# Patient Record
Sex: Female | Born: 1995 | Race: Black or African American | Hispanic: No | Marital: Single
Health system: Midwestern US, Community
[De-identification: ages and names within clinical notes are randomized; demographics above are authoritative.]

## PROBLEM LIST (undated history)

## (undated) ENCOUNTER — Inpatient Hospital Stay (HOSPITAL_COMMUNITY): Payer: Self-pay

## (undated) ENCOUNTER — Ambulatory Visit (HOSPITAL_COMMUNITY): Admission: EM | Discharge status: Absent without leave | Payer: Medicaid Other

## (undated) DIAGNOSIS — M1990 Unspecified osteoarthritis, unspecified site: Secondary | ICD-10-CM

## (undated) DIAGNOSIS — A749 Chlamydial infection, unspecified: Secondary | ICD-10-CM

## (undated) DIAGNOSIS — Z8619 Personal history of other infectious and parasitic diseases: Secondary | ICD-10-CM

## (undated) HISTORY — DX: Personal history of other infectious and parasitic diseases: Z86.19

## (undated) HISTORY — PX: NO PAST SURGERIES: SHX2092

---
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
## 2015-05-29 ENCOUNTER — Encounter (HOSPITAL_COMMUNITY): Payer: Self-pay | Admitting: Emergency Medicine

## 2015-05-29 ENCOUNTER — Emergency Department (HOSPITAL_COMMUNITY)
Admission: EM | Admit: 2015-05-29 | Discharge: 2015-05-29 | Disposition: A | Discharge status: Home / Self Care | Payer: Medicaid Other | Attending: Emergency Medicine | Admitting: Emergency Medicine

## 2015-05-29 DIAGNOSIS — Z8751 Personal history of pre-term labor: Secondary | ICD-10-CM | POA: Diagnosis not present

## 2015-05-29 DIAGNOSIS — M1990 Unspecified osteoarthritis, unspecified site: Secondary | ICD-10-CM | POA: Diagnosis not present

## 2015-05-29 DIAGNOSIS — Z79899 Other long term (current) drug therapy: Secondary | ICD-10-CM | POA: Insufficient documentation

## 2015-05-29 DIAGNOSIS — Z8619 Personal history of other infectious and parasitic diseases: Secondary | ICD-10-CM | POA: Diagnosis not present

## 2015-05-29 DIAGNOSIS — J45901 Unspecified asthma with (acute) exacerbation: Secondary | ICD-10-CM | POA: Diagnosis not present

## 2015-05-29 DIAGNOSIS — F17210 Nicotine dependence, cigarettes, uncomplicated: Secondary | ICD-10-CM | POA: Diagnosis not present

## 2015-05-29 DIAGNOSIS — J45909 Unspecified asthma, uncomplicated: Secondary | ICD-10-CM | POA: Diagnosis present

## 2015-05-29 MED ORDER — PREDNISONE 20 MG PO TABS
60.0000 mg | ORAL_TABLET | Freq: Once | ORAL | Status: AC
  Administered 2015-05-29: 60 mg via ORAL
  Filled 2015-05-29: qty 3

## 2015-05-29 MED ORDER — ALBUTEROL SULFATE HFA 108 (90 BASE) MCG/ACT IN AERS: 2.0000 | INHALATION_SPRAY | RESPIRATORY_TRACT | Status: DC | PRN

## 2015-05-29 MED ORDER — ALBUTEROL SULFATE HFA 108 (90 BASE) MCG/ACT IN AERS
2.0000 | INHALATION_SPRAY | RESPIRATORY_TRACT | Status: DC | PRN
  Filled 2015-05-29: qty 6.7

## 2015-05-29 MED ORDER — IPRATROPIUM-ALBUTEROL 0.5-2.5 (3) MG/3ML IN SOLN
3.0000 mL | Freq: Once | RESPIRATORY_TRACT | Status: AC
  Administered 2015-05-29: 3 mL via RESPIRATORY_TRACT
  Filled 2015-05-29: qty 3

## 2015-05-29 MED ORDER — PREDNISONE 20 MG PO TABS: 40.0000 mg | ORAL_TABLET | Freq: Every day | ORAL | Status: DC

## 2015-05-29 NOTE — ED Provider Notes (Signed)
CSN: [id number]     Arrival date & time 05/29/15  0350 History   First MD Initiated Contact with Patient 05/29/15 0402     Chief Complaint  Patient presents with  . Asthma     (Consider location/radiation/quality/duration/timing/severity/associated sxs/prior Treatment) HPI Comments: Patient presents to the emergency department with chief complaint of asthma exacerbation. She states that her asthma started clearing of yesterday. She does not have any medications to take at home for this. She denies any fever productive cough. Her symptoms are exacerbated with the seasons changing. There are no other associated symptoms.  The history is provided by the patient. No language interpreter was used.    Past Medical History  Diagnosis Date  . History of chicken pox   . Asthma     used inhaler 1 wk ago  . Chlamydia infection   . Preterm delivery   . Arthritis    Past Surgical History  Procedure Laterality Date  . No past surgeries     Family History  Problem Relation Age of Onset  . Migraines Mother   . Mitral valve prolapse Mother   . Anemia Maternal Aunt   . Mitral valve prolapse Maternal Aunt   . Heart disease Paternal Uncle   . Mitral valve prolapse Maternal Grandmother   . Dementia Maternal Grandmother   . Kidney disease Paternal Grandmother     dialysis  . Cancer Paternal Grandfather     Lung   Social History  Substance Use Topics  . Smoking status: Current Some Day Smoker -- 0.50 packs/day    Types: Cigarettes  . Smokeless tobacco: None  . Alcohol Use: Yes     Comment: occ   OB History    Gravida Para Term Preterm AB TAB SAB Ectopic Multiple Living   Review of Systems  Respiratory: Positive for chest tightness and wheezing.   All other systems reviewed and are negative.     Allergies  Pollen extract  Home Medications   Prior to Admission medications   Medication Sig Start Date End Date Taking? Authorizing Provider  albuterol  (PROVENTIL HFA;VENTOLIN HFA) 108 (90 BASE) MCG/ACT inhaler Inhale 2 puffs into the lungs every 6 (six) hours as needed for wheezing or shortness of breath (wheezing).    Historical Provider, MD  albuterol (PROVENTIL HFA;VENTOLIN HFA) 108 (90 BASE) MCG/ACT inhaler Inhale 1-2 puffs into the lungs every 6 (six) hours as needed for wheezing or shortness of breath. 09/09/14   Samantha Tripp [place], PA-C  albuterol (PROVENTIL) (2.5 MG/3ML) 0.083% nebulizer solution Take 3 mLs (2.5 mg total) by nebulization every 6 (six) hours as needed for wheezing or shortness of breath. 11/16/13   Tiffany Neva Seat, PA-C  predniSONE (STERAPRED UNI-PAK 21 TAB) 10 MG (21) TBPK tablet Take 1 tablet (10 mg total) by mouth daily. Take 6 tabs by mouth daily  for 2 days, then 5 tabs for 2 days, then 4 tabs for 2 days, then 3 tabs for 2 days, 2 tabs for 2 days, then 1 tab by mouth daily for 2 days 09/09/14   Lelon Mast Tripp Dowless, PA-C   BP 144/80 mmHg  Pulse 84  Temp(Src) 98.5 F (36.9 C) (Oral)  Resp 20  Ht  (1.6 m)  Wt 52.164 kg  BMI 20.38 kg/m2  SpO2 95% Physical Exam  Constitutional: She is oriented to person, place, and time. She appears well-developed and well-nourished.  HENT:  Head:  Normocephalic and atraumatic.  Eyes: Conjunctivae and EOM are normal. Pupils are equal, round, and reactive to light.  Neck: Normal range of motion. Neck supple.  Cardiovascular: Normal rate and regular rhythm.  Exam reveals no gallop and no friction rub.   No murmur heard. Pulmonary/Chest: No respiratory distress. She has wheezes. She has no rales. She exhibits no tenderness.  Increased work of breathing Speaks in 5-7 word sentences Bilateral wheezes Accessory muscle use  Abdominal: Soft. Bowel sounds are normal. She exhibits no distension and no mass. There is no tenderness. There is no rebound and no guarding.  Musculoskeletal: Normal range of motion. She exhibits no edema or tenderness.  Neurological: She is alert and  oriented to person, place, and time.  Skin: Skin is warm and dry.  Psychiatric: She has a normal mood and affect. Her behavior is normal. Judgment and thought content normal.  Nursing note and vitals reviewed.   ED Course  Procedures (including critical care time)   MDM   Final diagnoses:  Asthma exacerbation    Wheezing resolved after nebulizer treatment.  Patient feels much better. Prednisone given in the ED and pt will bd dc with 5 day burst. Pt states they are breathing at baseline. Pt has been instructed to continue using prescribed medications and to speak with PCP about today's exacerbation.      Roxy Horsemanobert Merit [place], PA-C 05/29/15 [id number]  Shon Batonourtney F Horton, MD 05/29/15 [id number]

## 2015-05-29 NOTE — Discharge Instructions (Signed)

## 2015-05-29 NOTE — ED Notes (Signed)
Pt states she has asthma and it started bothering her yesterday  Pt states she called EMS twice yesterday and then it started again tonight  Pt states she does not have an inhaler or nebulizer at home

## 2015-07-14 ENCOUNTER — Encounter: Payer: Self-pay | Admitting: Emergency Medicine

## 2015-07-14 ENCOUNTER — Emergency Department
Admission: EM | Admit: 2015-07-14 | Discharge: 2015-07-14 | Disposition: A | Discharge status: Home / Self Care | Payer: Medicaid Other | Attending: Emergency Medicine | Admitting: Emergency Medicine

## 2015-07-14 DIAGNOSIS — Z7952 Long term (current) use of systemic steroids: Secondary | ICD-10-CM | POA: Insufficient documentation

## 2015-07-14 DIAGNOSIS — M1990 Unspecified osteoarthritis, unspecified site: Secondary | ICD-10-CM | POA: Diagnosis not present

## 2015-07-14 DIAGNOSIS — F17210 Nicotine dependence, cigarettes, uncomplicated: Secondary | ICD-10-CM | POA: Insufficient documentation

## 2015-07-14 DIAGNOSIS — J45901 Unspecified asthma with (acute) exacerbation: Secondary | ICD-10-CM

## 2015-07-14 DIAGNOSIS — R0602 Shortness of breath: Secondary | ICD-10-CM | POA: Diagnosis present

## 2015-07-14 MED ORDER — IPRATROPIUM-ALBUTEROL 0.5-2.5 (3) MG/3ML IN SOLN
3.0000 mL | Freq: Once | RESPIRATORY_TRACT | Status: AC
  Administered 2015-07-14: 3 mL via RESPIRATORY_TRACT

## 2015-07-14 MED ORDER — ALBUTEROL SULFATE HFA 108 (90 BASE) MCG/ACT IN AERS: 2.0000 | INHALATION_SPRAY | RESPIRATORY_TRACT | Status: DC | PRN

## 2015-07-14 MED ORDER — IPRATROPIUM-ALBUTEROL 0.5-2.5 (3) MG/3ML IN SOLN
RESPIRATORY_TRACT | Status: AC
  Filled 2015-07-14: qty 3

## 2015-07-14 NOTE — Discharge Instructions (Signed)
Asthma, Acute Bronchospasm °Acute bronchospasm caused by asthma is also referred to as an asthma attack. Bronchospasm means your air passages become narrowed. The narrowing is caused by inflammation and tightening of the muscles in the air tubes (bronchi) in your lungs. This can make it hard to breathe or cause you to wheeze and cough. °CAUSES °Possible triggers are: °· Animal dander from the skin, hair, or feathers of animals. °· Dust mites contained in house dust. °· Cockroaches. °· Pollen from trees or grass. °· Mold. °· Cigarette or tobacco smoke. °· Air pollutants such as dust, household cleaners, hair sprays, aerosol sprays, paint fumes, strong chemicals, or strong odors. °· Cold air or weather changes. Cold air may trigger inflammation. Winds increase molds and pollens in the air. °· Strong emotions such as crying or laughing hard. °· Stress. °· Certain medicines such as aspirin or beta-blockers. °· Sulfites in foods and drinks, such as dried fruits and wine. °· Infections or inflammatory conditions, such as a flu, cold, or inflammation of the nasal membranes (rhinitis). °· Gastroesophageal reflux disease (GERD). GERD is a condition where stomach acid backs up into your esophagus. °· Exercise or strenuous activity. °SIGNS AND SYMPTOMS  °· Wheezing. °· Excessive coughing, particularly at night. °· Chest tightness. °· Shortness of breath. °DIAGNOSIS  °Your health care provider will ask you about your medical history and perform a physical exam. A chest X-ray or blood testing may be performed to look for other causes of your symptoms or other conditions that may have triggered your asthma attack.  °TREATMENT  °Treatment is aimed at reducing inflammation and opening up the airways in your lungs.  Most asthma attacks are treated with inhaled medicines. These include quick relief or rescue medicines (such as bronchodilators) and controller medicines (such as inhaled corticosteroids). These medicines are sometimes  given through an inhaler or a nebulizer. Systemic steroid medicine taken by mouth or given through an IV tube also can be used to reduce the inflammation when an attack is moderate or severe. Antibiotic medicines are only used if a bacterial infection is present.  °HOME CARE INSTRUCTIONS  °· Rest. °· Drink plenty of liquids. This helps the mucus to remain thin and be easily coughed up. Only use caffeine in moderation and do not use alcohol until you have recovered from your illness. °· Do not smoke. Avoid being exposed to secondhand smoke. °· You play a critical role in keeping yourself in good health. Avoid exposure to things that cause you to wheeze or to have breathing problems. °· Keep your medicines up-to-date and available. Carefully follow your health care provider's treatment plan. °· Take your medicine exactly as prescribed. °· When pollen or pollution is bad, keep windows closed and use an air conditioner or go to places with air conditioning. °· Asthma requires careful medical care. See your health care provider for a follow-up as advised. If you are more than [redacted] weeks pregnant and you were prescribed any new medicines, let your obstetrician know about the visit and how you are doing. Follow up with your health care provider as directed. °· After you have recovered from your asthma attack, make an appointment with your outpatient doctor to talk about ways to reduce the likelihood of future attacks. If you do not have a doctor who manages your asthma, make an appointment with a primary care doctor to discuss your asthma. °SEEK IMMEDIATE MEDICAL CARE IF:  °· You are getting worse. °· You have trouble breathing. If severe, call your local   emergency services (911 in the [place] °· You develop chest pain or discomfort. °· You are vomiting. °· You are not able to keep fluids down. °· You are coughing up yellow, green, brown, or bloody sputum. °· You have a fever and your symptoms suddenly get worse. °· You have  trouble swallowing. °MAKE SURE YOU:  °· Understand these instructions. °· Will watch your condition. °· Will get help right away if you are not doing well or get worse. °  °This information is not intended to replace advice given to you by your health care provider. Make sure you discuss any questions you have with your health care provider. °  °Document Released: 04/21/2006 Document Revised: 01/09/2013 Document Reviewed: 07/12/2012 °Elsevier Interactive Patient Education ©2016 [employer] ° °Asthma Attack Prevention °While you may not be able to control the fact that you have asthma, you can take actions to prevent asthma attacks. The best way to prevent asthma attacks is to maintain good control of your asthma. You can achieve this by: °· Taking your medicines as directed. °· Avoiding things that can irritate your airways or make your asthma symptoms worse (asthma triggers). °· Keeping track of how well your asthma is controlled and of any changes in your symptoms. °· Responding quickly to worsening asthma symptoms (asthma attack). °· Seeking emergency care when it is needed. °WHAT ARE SOME WAYS TO PREVENT AN ASTHMA ATTACK? °Have a Plan °Work with your health care provider to create a written plan for managing and treating your asthma attacks (asthma action plan). This plan includes: °· A list of your asthma triggers and how you can avoid them. °· Information on when medicines should be taken and when their dosages should be changed. °· The use of a device that measures how well your lungs are working (peak flow meter). °Monitor Your Asthma °Use your peak flow meter and record your results in a journal every day. A drop in your peak flow numbers on one or more days may indicate the start of an asthma attack. This can happen even before you start to feel symptoms. You can prevent an asthma attack from getting worse by following the steps in your asthma action plan. °Avoid Asthma Triggers °Work with your asthma  health care provider to find out what your asthma triggers are. This can be done by: °· Allergy testing. °· Keeping a journal that notes when asthma attacks occur and the factors that may have contributed to them. °· Determining if there are other medical conditions that are making your asthma worse. °Once you have determined your asthma triggers, take steps to avoid them. This may include avoiding excessive or prolonged exposure to: °· Dust. Have someone dust and vacuum your home for you once or twice a week. Using a high-efficiency particulate arrestance (HEPA) vacuum is best. °· Smoke. This includes campfire smoke, forest fire smoke, and secondhand smoke from tobacco products. °· Pet dander. Avoid contact with animals that you know you are allergic to. °· Allergens from trees, grasses or pollens. Avoid spending a lot of time outdoors when pollen counts are high, and on very windy days. °· Very cold, dry, or humid air. °· Mold. °· Foods that contain high amounts of sulfites. °· Strong odors. °· Outdoor air pollutants, such as engine exhaust. °· Indoor air pollutants, such as aerosol sprays and fumes from household cleaners. °· Household pests, including dust mites and cockroaches, and pest droppings. °· Certain medicines, including NSAIDs. Always talk to your health   care provider before stopping or starting any new medicines. °Medicines °Take over-the-counter and prescription medicines only as told by your health care provider. Many asthma attacks can be prevented by carefully following your medicine schedule. Taking your medicines correctly is especially important when you cannot avoid certain asthma triggers. °Act Quickly °If an asthma attack does happen, acting quickly can decrease how severe it is and how long it lasts. Take these steps:  °· Pay attention to your symptoms. If you are coughing, wheezing, or having difficulty breathing, do not wait to see if your symptoms go away on their own. Follow your asthma  action plan. °· If you have followed your asthma action plan and your symptoms are not improving, call your health care provider or seek immediate medical care at the nearest hospital. °It is important to note how often you need to use your fast-acting rescue inhaler. If you are using your rescue inhaler more often, it may mean that your asthma is not under control. Adjusting your asthma treatment plan may help you to prevent future asthma attacks and help you to gain better control of your condition. °HOW CAN I PREVENT AN ASTHMA ATTACK WHEN I EXERCISE? °Follow advice from your health care provider about whether you should use your fast-acting inhaler before exercising. Many people with asthma experience exercise-induced bronchoconstriction (EIB). This condition often worsens during vigorous exercise in cold, humid, or dry environments. Usually, people with EIB can stay very active by pre-treating with a fast-acting inhaler before exercising. °  °This information is not intended to replace advice given to you by your health care provider. Make sure you discuss any questions you have with your health care provider. °  °Document Released: 12/23/2008 Document Revised: 09/25/2014 Document Reviewed: 06/06/2014 °Elsevier Interactive Patient Education ©2016 [employer] Inc[employer] ° °

## 2015-07-14 NOTE — ED Provider Notes (Signed)
[facility] Emergency Department Provider Note  ____________________________________________  Time seen: 3:50 AM  I have reviewed the triage vital signs and the nursing notes.   HISTORY  Chief Complaint Shortness of Breath    HPI Tammy [name] is a 20 y.o. female who complains of shortness of breath and wheezing for the last 24 hours. She has asthma, does not have any albuterol because she ran out and she lost a recent prescription given to her by another ED. Has some nonproductive coughing as well. No fever chills or sweats. No chest pain. Otherwise in usual state of health.  During my interview she took her cell phone and began taking voicemail and making a phone call.     Past Medical History  Diagnosis Date  . History of chicken pox   . Asthma     used inhaler 1 wk ago  . Chlamydia infection   . Preterm delivery   . Arthritis      Patient Active Problem List   Diagnosis Date Noted  . NSVD (normal spontaneous vaginal delivery) 11/23/2012  . Asthma, severe persistent, poorly-controlled 06/22/2012  . Asthma, chronic 06/21/2012  . History of unprotected sex 06/21/2012  . Abdominal pregnancy with intrauterine pregnancy 06/21/2012  . Depo-Provera contraceptive status 02/11/2012  . Asthma 11/05/2011  . Postpartum care following vaginal delivery 01/13/2011  . Chlamydia trachomatis infection of other genitourinary sites 01/13/2011     Past Surgical History  Procedure Laterality Date  . No past surgeries       Current Outpatient Rx  Name  Route  Sig  Dispense  Refill  . albuterol (PROVENTIL HFA) 108 (90 Base) MCG/ACT inhaler   Inhalation   Inhale 2 puffs into the lungs every 4 (four) hours as needed for wheezing or shortness of breath.   1 Inhaler   0   . predniSONE (DELTASONE) 20 MG tablet   Oral   Take 2 tablets (40 mg total) by mouth daily.   10 tablet   0      Allergies Pollen extract   Family History  Problem Relation  Age of Onset  . Migraines Mother   . Mitral valve prolapse Mother   . Anemia Maternal Aunt   . Mitral valve prolapse Maternal Aunt   . Heart disease Paternal Uncle   . Mitral valve prolapse Maternal Grandmother   . Dementia Maternal Grandmother   . Kidney disease Paternal Grandmother     dialysis  . Cancer Paternal Grandfather     Lung    Social History Social History  Substance Use Topics  . Smoking status: Current Some Day Smoker -- 0.50 packs/day    Types: Cigarettes  . Smokeless tobacco: None  . Alcohol Use: Yes     Comment: occ    Review of Systems  Constitutional:   No fever or chills.  ENT:   No sore throat. No rhinorrhea. Cardiovascular:   No chest pain. Respiratory:   Positive shortness of breath and nonproductive cough. Gastrointestinal:   Negative for abdominal pain, vomiting and diarrhea.  10-point ROS otherwise negative.  ____________________________________________   PHYSICAL EXAM:  VITAL SIGNS: ED Triage Vitals  Enc Vitals Group     BP 07/14/15 0316 126/72 mmHg     Pulse Rate 07/14/15 0316 68     Resp 07/14/15 0316 25     Temp 07/14/15 0316 97.7 F (36.5 C)     Temp Source 07/14/15 0316 Oral     SpO2 07/14/15 0316 98 %  Weight 07/14/15 0316 120 lb (54.432 kg)     Height 07/14/15 0316 5\' 3"  (1.6 m)     Head Cir --      Peak Flow --      Pain Score --      Pain Loc --      Pain Edu? --      Excl. in GC? --     Vital signs reviewed, nursing assessments reviewed.   Constitutional:   Alert and oriented. Well appearing and in no distress. Eyes:   No scleral icterus. No conjunctival pallor. PERRL. EOMI.  No nystagmus. ENT   Head:   Normocephalic and atraumatic.   Nose:   No congestion/rhinnorhea. No septal hematoma   Mouth/Throat:   MMM, no pharyngeal erythema. No peritonsillar mass.    Neck:   No stridor. No SubQ emphysema. No meningismus. Hematological/Lymphatic/Immunilogical:   No cervical lymphadenopathy. Cardiovascular:    RRR. Symmetric bilateral radial and DP pulses.  No murmurs.  Respiratory:   Normal respiratory effort without tachypnea nor retractions. Diffuse mild expiratory wheezing Gastrointestinal:   Soft and nontender. Non distended. There is no CVA tenderness.  No rebound, rigidity, or guarding. Genitourinary:   deferred Musculoskeletal:   Nontender with normal range of motion in all extremities. No joint effusions.  No lower extremity tenderness.  No edema. Neurologic:   Normal speech and language.  CN 2-10 normal. Motor grossly intact. No gross focal neurologic deficits are appreciated.   ____________________________________________    LABS (pertinent positives/negatives) (all labs ordered are listed, but only abnormal results are displayed) Labs Reviewed - No data to display ____________________________________________   EKG    ____________________________________________    RADIOLOGY    ____________________________________________   PROCEDURES   ____________________________________________   INITIAL IMPRESSION / ASSESSMENT AND PLAN / ED COURSE  Pertinent labs & imaging results that were available during my care of the patient were reviewed by me and considered in my medical decision making (see chart for details).  Patient presents with wheezing, otherwise not in distress and without focal findings. Consistent with asthma exacerbation due to medication noncompliance. After receiving 1 DuoNeb in the ED her wheezing resolved and she feels back to normal. I'll prescribe her albuterol again, follow up with primary care. Low suspicion for pneumonia and pneumothorax or PE.     ____________________________________________   FINAL CLINICAL IMPRESSION(S) / ED DIAGNOSES  Final diagnoses:  Asthma exacerbation       Portions of this note were generated with dragon dictation software. Dictation errors may occur despite best attempts at proofreading.   Sharman CheekPhillip Ardenia Stiner,  MD 07/14/15 [phone]

## 2015-07-14 NOTE — ED Notes (Signed)
Pt. States difficulty breathing for the past 24 hours.  Pt. States out of rescue inhaler.  Pt. Has audible wheezing.

## 2015-08-10 ENCOUNTER — Encounter (HOSPITAL_COMMUNITY): Payer: Self-pay | Admitting: Emergency Medicine

## 2015-08-10 ENCOUNTER — Emergency Department (HOSPITAL_COMMUNITY)
Admission: EM | Admit: 2015-08-10 | Discharge: 2015-08-10 | Disposition: A | Discharge status: Home / Self Care | Payer: Medicaid Other | Attending: Emergency Medicine | Admitting: Emergency Medicine

## 2015-08-10 DIAGNOSIS — F17210 Nicotine dependence, cigarettes, uncomplicated: Secondary | ICD-10-CM | POA: Insufficient documentation

## 2015-08-10 DIAGNOSIS — R0602 Shortness of breath: Secondary | ICD-10-CM | POA: Diagnosis present

## 2015-08-10 DIAGNOSIS — J45901 Unspecified asthma with (acute) exacerbation: Secondary | ICD-10-CM

## 2015-08-10 MED ORDER — ALBUTEROL SULFATE (2.5 MG/3ML) 0.083% IN NEBU
INHALATION_SOLUTION | RESPIRATORY_TRACT | Status: AC
  Filled 2015-08-10: qty 3

## 2015-08-10 MED ORDER — PREDNISONE 50 MG PO TABS: 50.0000 mg | ORAL_TABLET | Freq: Every day | ORAL | 0 refills | Status: AC

## 2015-08-10 MED ORDER — ALBUTEROL SULFATE HFA 108 (90 BASE) MCG/ACT IN AERS: 2.0000 | INHALATION_SPRAY | RESPIRATORY_TRACT | 0 refills | Status: DC | PRN

## 2015-08-10 MED ORDER — ALBUTEROL SULFATE (2.5 MG/3ML) 0.083% IN NEBU
2.5000 mg | INHALATION_SOLUTION | Freq: Once | RESPIRATORY_TRACT | Status: AC
  Administered 2015-08-10: 2.5 mg via RESPIRATORY_TRACT

## 2015-08-10 MED ORDER — ALBUTEROL SULFATE HFA 108 (90 BASE) MCG/ACT IN AERS
4.0000 | INHALATION_SPRAY | Freq: Once | RESPIRATORY_TRACT | Status: AC
  Administered 2015-08-10: 4 via RESPIRATORY_TRACT
  Filled 2015-08-10: qty 6.7

## 2015-08-10 MED ORDER — PREDNISONE 20 MG PO TABS
50.0000 mg | ORAL_TABLET | Freq: Once | ORAL | Status: AC
  Administered 2015-08-10: 50 mg via ORAL
  Filled 2015-08-10: qty 3

## 2015-08-10 NOTE — ED Notes (Signed)
PT did not stay to sign out . Pt stated " I already know everything."

## 2015-08-10 NOTE — ED Provider Notes (Signed)
[facility]-EMERGENCY DEPT Provider Note   CSN: [id number] Arrival date & time: 08/10/15  4098  First Provider Contact:  First MD Initiated Contact with Patient 08/10/15 1102        History   Chief Complaint Chief Complaint  Patient presents with  . Shortness of Breath  . Asthma    HPI Tammy [name] is a 20 y.o. female.  20 yo F with past medical history of mild intermittent asthma who presents with acute onset shortness of breath. The patient has been seen in the ED multiple times for this and receives an inhaler from the ED provider and usually improves. However, she says she ran out of her last inhaler. She does not see a primary care doctor for her asthma. She states over last 2 days she has had progressively worsening. Mild, nonproductive cough and shortness of breath. The symptoms feel similar to her usual asthma exacerbation. She has no history of intubations or admissions for asthma. No recent fevers or chills. No cough or sputum production. No lower extremity swelling. No history of DVT or PE.  HPI  Past Medical History:  Diagnosis Date  . Arthritis   . Asthma    used inhaler 1 wk ago  . Chlamydia infection   . History of chicken pox   . Preterm delivery     Patient Active Problem List   Diagnosis Date Noted  . NSVD (normal spontaneous vaginal delivery) 11/23/2012  . Asthma, severe persistent, poorly-controlled 06/22/2012  . Asthma, chronic 06/21/2012  . History of unprotected sex 06/21/2012  . Abdominal pregnancy with intrauterine pregnancy 06/21/2012  . Depo-Provera contraceptive status 02/11/2012  . Asthma 11/05/2011  . Postpartum care following vaginal delivery 01/13/2011  . Chlamydia trachomatis infection of other genitourinary sites 01/13/2011    Past Surgical History:  Procedure Laterality Date  . NO PAST SURGERIES      OB History    Gravida Para Term Preterm AB Living   SAB TAB Ectopic Multiple Live Births                   Home  Medications    Prior to Admission medications   Medication Sig Start Date End Date Taking? Authorizing Provider  albuterol (PROVENTIL HFA) 108 (90 Base) MCG/ACT inhaler Inhale 2 puffs into the lungs every 4 (four) hours as needed for wheezing or shortness of breath. 08/10/15   Shaune Pollack, MD  predniSONE (DELTASONE) 50 MG tablet Take 1 tablet (50 mg total) by mouth daily. 08/11/15 08/15/15  Shaune Pollack, MD    Family History Family History  Problem Relation Age of Onset  . Migraines Mother   . Mitral valve prolapse Mother   . Anemia Maternal Aunt   . Mitral valve prolapse Maternal Aunt   . Heart disease Paternal Uncle   . Mitral valve prolapse Maternal Grandmother   . Dementia Maternal Grandmother   . Kidney disease Paternal Grandmother     dialysis  . Cancer Paternal Grandfather     Lung    Social History Social History  Substance Use Topics  . Smoking status: Current Some Day Smoker    Packs/day: 0.50    Types: Cigarettes  . Smokeless tobacco: Current User  . Alcohol use Yes     Comment: occ     Allergies   Pollen extract   Review of Systems Review of Systems  Constitutional: Negative for chills, fatigue and fever.  HENT: Negative for  congestion and rhinorrhea.   Respiratory: Positive for cough, shortness of breath and wheezing.   Cardiovascular: Negative for chest pain and leg swelling.  Gastrointestinal: Negative for abdominal pain and nausea.  Genitourinary: Negative for dysuria and flank pain.  Musculoskeletal: Negative for neck pain.  Skin: Negative for rash.  Allergic/Immunologic: Negative for immunocompromised state.  Neurological: Negative for syncope, weakness and headaches.     Physical Exam Updated Vital Signs BP (!) 126/101   Pulse 80   Temp 98.7 F (37.1 C) (Oral)   Resp 19   Ht 5\' 3"  (1.6 m)   Wt 115 lb (52.2 kg)   SpO2 100%   BMI 20.37 kg/m   Physical Exam  Constitutional: She appears well-developed and well-nourished. No  distress.  HENT:  Head: Normocephalic.  Mouth/Throat: Oropharynx is clear and moist. No oropharyngeal exudate.  Eyes: Conjunctivae are normal. Pupils are equal, round, and reactive to light.  Neck: Normal range of motion. Neck supple.  Cardiovascular: Normal rate, regular rhythm and normal heart sounds.  Exam reveals no friction rub.   No murmur heard. Pulmonary/Chest: Effort normal. No respiratory distress. She has wheezes (Faint, end expiratory and only with forced expiration. Normal work of breathing.). She has no rales.  Abdominal: Soft. Bowel sounds are normal. She exhibits no distension. There is no tenderness.  Musculoskeletal: She exhibits no edema.  Neurological: She is alert. She exhibits normal muscle tone.  Skin: Skin is warm.  Nursing note and vitals reviewed.    ED Treatments / Results  Labs (all labs ordered are listed, but only abnormal results are displayed) Labs Reviewed - No data to display  EKG  EKG Interpretation None       Radiology No results found.  Procedures Procedures (including critical care time)  Medications Ordered in ED Medications  albuterol (PROVENTIL) (2.5 MG/3ML) 0.083% nebulizer solution 2.5 mg (2.5 mg Nebulization Given 08/10/15 1012)  predniSONE (DELTASONE) tablet 50 mg (50 mg Oral Given 08/10/15 1156)  albuterol (PROVENTIL HFA;VENTOLIN HFA) 108 (90 Base) MCG/ACT inhaler 4 puff (4 puffs Inhalation Given 08/10/15 1157)     Initial Impression / Assessment and Plan / ED Course  I have reviewed the triage vital signs and the nursing notes.  Pertinent labs & imaging results that were available during my care of the patient were reviewed by me and considered in my medical decision making (see chart for details).  Clinical Course  20 yo female with past medical history of mild intermittent asthma who presents with mild cough, wheezing, and shortness of breath after running out of her albuterol. No fevers, chills, sputum production or focal  lung findings to suggest underlying pneumonia. Vital signs are stable and work of breathing is normal. She has mild wheezing on forced end expiration only with good aeration. Wheezing completely resolved with albuterol inhaler. Will give short course of steroids as well as an inhaler. Encouraged patient to follow up with a primary care doctor and encouraged her to return precautions. Otherwise, patient well-appearing, in no acute distress. No signs of DVT or PE.  Final Clinical Impressions(s) / ED Diagnoses   Final diagnoses:  Asthma exacerbation    New Prescriptions Discharge Medication List as of 08/10/2015 12:02 PM    START taking these medications   Details  predniSONE (DELTASONE) 50 MG tablet Take 1 tablet (50 mg total) by mouth daily., Starting Mon 08/11/2015, Until Fri 08/15/2015, Print         Shaune Pollack, MD 08/10/15 2136

## 2015-08-10 NOTE — ED Triage Notes (Signed)
Pt.l stated, Im out of my inhaler and Im getting ready to go out of town, and yall are the ones who give it to me.

## 2016-05-31 ENCOUNTER — Emergency Department (HOSPITAL_COMMUNITY)
Admission: EM | Admit: 2016-05-31 | Discharge: 2016-05-31 | Disposition: A | Discharge status: Home / Self Care | Payer: Medicaid Other | Attending: Emergency Medicine | Admitting: Emergency Medicine

## 2016-05-31 ENCOUNTER — Encounter (HOSPITAL_COMMUNITY): Payer: Self-pay | Admitting: Emergency Medicine

## 2016-05-31 DIAGNOSIS — A5901 Trichomonal vulvovaginitis: Secondary | ICD-10-CM | POA: Diagnosis not present

## 2016-05-31 DIAGNOSIS — A599 Trichomoniasis, unspecified: Secondary | ICD-10-CM

## 2016-05-31 DIAGNOSIS — N939 Abnormal uterine and vaginal bleeding, unspecified: Secondary | ICD-10-CM

## 2016-05-31 DIAGNOSIS — J45909 Unspecified asthma, uncomplicated: Secondary | ICD-10-CM | POA: Insufficient documentation

## 2016-05-31 DIAGNOSIS — F17210 Nicotine dependence, cigarettes, uncomplicated: Secondary | ICD-10-CM | POA: Diagnosis not present

## 2016-05-31 LAB — WET PREP, GENITAL
Clue Cells Wet Prep HPF POC: NONE SEEN
Sperm: NONE SEEN
Yeast Wet Prep HPF POC: NONE SEEN

## 2016-05-31 LAB — POC URINE PREG, ED: Preg Test, Ur: NEGATIVE

## 2016-05-31 MED ORDER — METRONIDAZOLE 500 MG PO TABS: 2000.0000 mg | ORAL_TABLET | Freq: Once | ORAL | 0 refills | Status: AC

## 2016-05-31 NOTE — ED Notes (Signed)
Pt states, "what are you going to do about my finger." pt denies injury to the area, pt has swelling to R index finger, pt continues to text on phone while this RN & PA is in the room

## 2016-05-31 NOTE — ED Provider Notes (Signed)
[facility]-EMERGENCY DEPT Provider Note   CSN: [id number] Arrival date & time: 05/31/16  4098     History   Chief Complaint Chief Complaint  Patient presents with  . Vaginal Bleeding  . Finger Injury    HPI Tammy [name] is a 21 y.o. female who presents with vaginal bleeding and right index finger pain. Past medical history significant for asthma and prior chlamydia infection. History is limited due to patient's irritability and unwillingness to discuss her symptoms. She was on the phone during most of the history and exam. She states that she's had vaginal bleeding and gone through multiple pads and tampons over the past week and a half. She had her period the end of April which was normal. She has a Nexplanon in her arm and is unsure of when it was placed but thinks it may have been couple months ago. She has not had abnormal vaginal bleeding before. She reports recent unprotected sex but is monogamous. She reports associated nausea and vomiting. No fever, abdominal pain, diarrhea, vaginal discharge, dysuria.   Additionally she has had some right index finger pain. She reports a "bump" on the lateral aspect of her index finger which she squeezed and expressed fluid out of. She states she works in [employer].   HPI  Past Medical History:  Diagnosis Date  . Arthritis   . Asthma    used inhaler 1 wk ago  . Chlamydia infection   . History of chicken pox   . Preterm delivery     Patient Active Problem List   Diagnosis Date Noted  . NSVD (normal spontaneous vaginal delivery) 11/23/2012  . Asthma, severe persistent, poorly-controlled 06/22/2012  . Asthma, chronic 06/21/2012  . History of unprotected sex 06/21/2012  . Abdominal pregnancy with intrauterine pregnancy 06/21/2012  . Depo-Provera contraceptive status 02/11/2012  . Asthma 11/05/2011  . Postpartum care following vaginal delivery 01/13/2011  . Chlamydia trachomatis infection of other genitourinary sites 01/13/2011    Past  Surgical History:  Procedure Laterality Date  . NO PAST SURGERIES      OB History    Gravida Para Term Preterm AB Living   2 2 1 1   2    SAB TAB Ectopic Multiple Live Births           2       Home Medications    Prior to Admission medications   Medication Sig Start Date End Date Taking? Authorizing Provider  albuterol (PROVENTIL HFA) 108 (90 Base) MCG/ACT inhaler Inhale 2 puffs into the lungs every 4 (four) hours as needed for wheezing or shortness of breath. 08/10/15   Shaune Pollack, MD    Family History Family History  Problem Relation Age of Onset  . Migraines Mother   . Mitral valve prolapse Mother   . Anemia Maternal Aunt   . Mitral valve prolapse Maternal Aunt   . Heart disease Paternal Uncle   . Mitral valve prolapse Maternal Grandmother   . Dementia Maternal Grandmother   . Kidney disease Paternal Grandmother        dialysis  . Cancer Paternal Grandfather        Lung    Social History Social History  Substance Use Topics  . Smoking status: Current Some Day Smoker    Packs/day: 0.50    Types: Cigarettes  . Smokeless tobacco: Current User  . Alcohol use Yes     Comment: occ     Allergies   Pollen extract   Review  of Systems Review of Systems  Constitutional: Negative for chills and fever.  Gastrointestinal: Positive for nausea and vomiting. Negative for abdominal pain and diarrhea.  Genitourinary: Positive for vaginal bleeding. Negative for dysuria, pelvic pain and vaginal discharge.  Musculoskeletal: Positive for arthralgias.  All other systems reviewed and are negative.    Physical Exam Updated Vital Signs BP (!) 122/91   Pulse 62   Temp 97.8 F (36.6 C) (Oral)   Resp 12   LMP 05/17/2016 (Approximate)   SpO2 99%   Physical Exam  Constitutional: She is oriented to person, place, and time. She appears well-developed and well-nourished. No distress.  On phone, irritable, and overall uncooperative with history  HENT:  Head:  Normocephalic and atraumatic.  Eyes: Conjunctivae are normal. Pupils are equal, round, and reactive to light. Right eye exhibits no discharge. Left eye exhibits no discharge. No scleral icterus.  Neck: Normal range of motion.  Cardiovascular: Normal rate and regular rhythm.  Exam reveals no gallop and no friction rub.   No murmur heard. Pulmonary/Chest: Effort normal and breath sounds normal. No respiratory distress. She has no wheezes. She has no rales. She exhibits no tenderness.  Abdominal: Soft. Bowel sounds are normal. She exhibits no distension and no mass. There is no tenderness. There is no rebound and no guarding. No hernia.  Genitourinary:  Genitourinary Comments: *Patient was talking on phone during pelvic exam* Pelvic: No inguinal lymphadenopathy or inguinal hernia noted. Normal external genitalia. No pain with speculum insertion. Closed cervical os with normal appearance - no rash or lesions. There is a moderate amount of blood in the vaginal vault and oozing from cervix. On bimanual examination no adnexal tenderness or cervical motion tenderness. Chaperone present during exam.    Musculoskeletal:  Right index finger: Small calloused area over lateral index finger with mild erythema surrounding it with dried serous fluid. FROM of finger. N/V intact  Neurological: She is alert and oriented to person, place, and time.  Skin: Skin is warm and dry.  Psychiatric: Her speech is normal. Her affect is angry. She is agitated. She expresses impulsivity.  Nursing note and vitals reviewed.    ED Treatments / Results  Labs (all labs ordered are listed, but only abnormal results are displayed) Labs Reviewed  WET PREP, GENITAL - Abnormal; Notable for the following:       Result Value   Trich, Wet Prep PRESENT (*)    WBC, Wet Prep HPF POC RARE (*)    All other components within normal limits  POC URINE PREG, ED  GC/CHLAMYDIA PROBE AMP ([place]) NOT AT [facility]    EKG  EKG  Interpretation None       Radiology No results found.  Procedures Procedures (including critical care time)  Medications Ordered in ED Medications - No data to display   Initial Impression / Assessment and Plan / ED Course  I have reviewed the triage vital signs and the nursing notes.  Pertinent labs & imaging results that were available during my care of the patient were reviewed by me and considered in my medical decision making (see chart for details).  21 year old female with abnormal vaginal bleeding. Vital signs are normal. Abdomen is nontender. Pelvic exam remarkable for blood in the vaginal vault and oozing from the cervix. Wet prep and G&C swab were obtained. Patient refused to stay for results of testing or any further workup.  For her finger she was advised to place Neosporin on wound and cover with a  Band-Aid. Also advised warm soaks. [facility]  Of note: Several hours later after pt was discharged, wet prep was noted to be abnormal for trichomonas. I personally called the patient on her phone and discussed her results. I advised that I would print a prescription for Flagyl 2g for her to pick up at the front desk. She verbalized understanding.   Final Clinical Impressions(s) / ED Diagnoses   Final diagnoses:  Vaginal bleeding  Trichomonas infection    New Prescriptions Discharge Medication List as of 05/31/2016 11:45 AM       Bethel BornGekas, [name] Marie, PA-C 05/31/16 2143    Lavera GuiseLiu, Dana Duo, MD 06/03/16 1332

## 2016-05-31 NOTE — ED Notes (Signed)
Pelvic exam done by Tresa EndoKelly - PA and Kenney Housemananya - EMT assisted.

## 2016-05-31 NOTE — ED Notes (Signed)
Pt declines vital signs, pt states, "i have got to go." this RN walked pt to registration so that she could update her contact information, pt provided a packet of bacitracin for finger upon discharge

## 2016-05-31 NOTE — ED Triage Notes (Signed)
Pt reports heavy vaginal bleeding x1.5 weeks, states she had her period at the end of April, has implant for birth control. Also reports swelling and pain to right index finger, denies injury.

## 2016-05-31 NOTE — ED Notes (Signed)
Pt agitated & found walking in the hallway, states, "if no one is here to see me by 11:45 I am leaving." pt directed to her room to discuss care plan, pt remains on phone and uncooperative, pt demanding work note, pt encouraged to stay for eval, this RN spoke with provider who agrees to perform the pelvic exam at this time, pt updated on plan, pt remains agitated and continues to threaten to leave, pt states, "i have been here for 3 hours and no one has done anything for me." this Rn apologized for the pts wait, PA at bedside

## 2016-05-31 NOTE — Discharge Instructions (Signed)
Please follow up with [facility] if bleeding continues - you will be called if any of your results are abnormal here. Do warm soaks and put neosporin on finger

## 2016-06-01 LAB — GC/CHLAMYDIA PROBE AMP (~~LOC~~) NOT AT ARMC
Chlamydia: NEGATIVE
Neisseria Gonorrhea: NEGATIVE

## 2016-06-02 ENCOUNTER — Emergency Department (HOSPITAL_COMMUNITY)
Admission: EM | Admit: 2016-06-02 | Discharge: 2016-06-02 | Disposition: A | Discharge status: Home / Self Care | Payer: Medicaid Other | Attending: Emergency Medicine | Admitting: Emergency Medicine

## 2016-06-02 ENCOUNTER — Encounter (HOSPITAL_COMMUNITY): Payer: Self-pay

## 2016-06-02 DIAGNOSIS — F17210 Nicotine dependence, cigarettes, uncomplicated: Secondary | ICD-10-CM | POA: Diagnosis not present

## 2016-06-02 DIAGNOSIS — N898 Other specified noninflammatory disorders of vagina: Secondary | ICD-10-CM | POA: Diagnosis present

## 2016-06-02 DIAGNOSIS — J45909 Unspecified asthma, uncomplicated: Secondary | ICD-10-CM | POA: Diagnosis not present

## 2016-06-02 DIAGNOSIS — A599 Trichomoniasis, unspecified: Secondary | ICD-10-CM | POA: Diagnosis not present

## 2016-06-02 MED ORDER — METRONIDAZOLE 500 MG PO TABS
2000.0000 mg | ORAL_TABLET | Freq: Once | ORAL | Status: AC
  Administered 2016-06-02: 2000 mg via ORAL
  Filled 2016-06-02: qty 4

## 2016-06-02 NOTE — Discharge Instructions (Signed)
Your partner needs to be tested. Do not have sexual intercourse until you and your partner has been treated. If you have sexual intercourse before then this could cause reinfection.  Follow-up with the primary care doctor's listed below.  Return the emergency Department for any fever, abdominal pain, vaginal bleeding, vaginal discharge or any other worsening or concerning symptoms.   If you do not have a primary care doctor you see regularly, please you the list below. Please call them to arrange for follow-up.    No Primary Care Doctor Call [facility]  [phone] Other agencies that provide inexpensive medical care    [facility]  [phone]    [facility]  [phone]    [facility]  [phone]    [facility]  [phone]    [facility]  [phone]    [facility]  [phone]

## 2016-06-02 NOTE — ED Provider Notes (Signed)
[facility]-EMERGENCY DEPT Provider Note   CSN: [id number] Arrival date & time: 06/02/16  1733   By signing my name below, Alexia Julien GirtPerkins, attest that this documentation has been prepared under the direction and in the presence of non physician practitioner, Graciella FreerLindsey Cressida Milford, PA-C.    Electronically Signed: Sandrea HammondAlexia Perkins, Scribe 06/02/2016. 6:41 PM.   History   Chief Complaint Chief Complaint  Patient presents with  . needs medicine   The history is provided by the patient. No language interpreter was used.   HPI Comments:  Tammy [name] is a 21 y.o. female who presents to the Emergency Department for treatment of Trichomonas. [facility]. At that time pelvic exam was performed and after discharge her wet prep returned positive for trichomonas. Pt was given instructions to [facility] which she did not do. She continues to have vaginal discharge but no new symptoms. She denies any fever, vaginal bleeding, abdominal pain.  Past Medical History:  Diagnosis Date  . Arthritis   . Asthma    used inhaler 1 wk ago  . Chlamydia infection   . History of chicken pox   . Preterm delivery     Patient Active Problem List   Diagnosis Date Noted  . NSVD (normal spontaneous vaginal delivery) 11/23/2012  . Asthma, severe persistent, poorly-controlled 06/22/2012  . Asthma, chronic 06/21/2012  . History of unprotected sex 06/21/2012  . Abdominal pregnancy with intrauterine pregnancy 06/21/2012  . Depo-Provera contraceptive status 02/11/2012  . Asthma 11/05/2011  . Postpartum care following vaginal delivery 01/13/2011  . Chlamydia trachomatis infection of other genitourinary sites 01/13/2011    Past Surgical History:  Procedure Laterality Date  . NO PAST SURGERIES      OB History    Gravida Para Term Preterm AB Living   2 2 1 1   2    SAB TAB Ectopic Multiple Live Births           2       Home Medications     Prior to Admission medications   Medication Sig Start Date End Date Taking? Authorizing Provider  albuterol (PROVENTIL HFA) 108 (90 Base) MCG/ACT inhaler Inhale 2 puffs into the lungs every 4 (four) hours as needed for wheezing or shortness of breath. 08/10/15   Shaune PollackIsaacs, Cameron, MD    Family History Family History  Problem Relation Age of Onset  . Migraines Mother   . Mitral valve prolapse Mother   . Anemia Maternal Aunt   . Mitral valve prolapse Maternal Aunt   . Heart disease Paternal Uncle   . Mitral valve prolapse Maternal Grandmother   . Dementia Maternal Grandmother   . Kidney disease Paternal Grandmother        dialysis  . Cancer Paternal Grandfather        Lung    Social History Social History  Substance Use Topics  . Smoking status: Current Some Day Smoker    Packs/day: 0.50    Types: Cigarettes  . Smokeless tobacco: Current User  . Alcohol use Yes     Comment: occ     Allergies   Pollen extract   Review of Systems Review of Systems  Constitutional: Negative for chills and fever.  Respiratory: Negative for shortness of breath.   Cardiovascular: Negative for chest pain.  Gastrointestinal: Negative for abdominal pain.  Genitourinary: Positive for vaginal discharge.     Physical Exam Updated  Vital Signs BP (!) 124/59 (BP Location: Right Arm)   Pulse 71   Temp 98 F (36.7 C) (Oral)   Resp 16   Ht 5\' 3"  (1.6 m)   Wt 49 kg   LMP 05/17/2016 (Approximate)   SpO2 100%   BMI 19.13 kg/m   Physical Exam  Constitutional: She appears well-developed and well-nourished.  Patient is on her phone and uncooperative throughout interview.  HENT:  Head: Normocephalic and atraumatic.  Eyes: Conjunctivae and EOM are normal. Right eye exhibits no discharge. Left eye exhibits no discharge. No scleral icterus.  Pulmonary/Chest: Effort normal.  Musculoskeletal: She exhibits no deformity.  Neurological: She is alert.  Skin: Skin is warm and dry.  Psychiatric: She  has a normal mood and affect. Her speech is normal and behavior is normal.  Nursing note and vitals reviewed.    ED Treatments / Results  DIAGNOSTIC STUDIES:  Oxygen Saturation is 100% on room air, normal, by my interpretation.    COORDINATION OF CARE:  6:35 PM Discussed treatment plan with pt at bedside and pt agreed to plan.  Labs (all labs ordered are listed, but only abnormal results are displayed) Labs Reviewed - No data to display  EKG  EKG Interpretation None       Radiology No results found.  Procedures Procedures (including critical care time)  Medications Ordered in ED Medications  metroNIDAZOLE (FLAGYL) tablet 2,000 mg (2,000 mg Oral Given 06/02/16 1904)     Initial Impression / Assessment and Plan / ED Course  I have reviewed the triage vital signs and the nursing notes.  Pertinent labs & imaging results that were available during my care of the patient were reviewed by me and considered in my medical decision making (see chart for details).     21 year old female who presents with known Trichomonas infection after a positive wet prep collected on 5/14 at [facility] in ED. Patient was called and told of her positive results and instructed to pick up an antibiotic which she did not do. Patient comes here today requesting  her medication. Patient denies any new symptoms. Patient is afebrile, non-toxic appearing, sitting comfortably on examination table. Records reviewed. ED note from 5/14 reviewed. Wet prep was positive for Trichomonas. No clue cells seen. Discussed with Dr. Eudelia Bunch. Will plan to treat with 2 g of Flagyl in the emergency department. Instructed patient that her partner needs to be treated as well. Instructed her to abstain from sexual intercourse with her partner until he has been treated. Strict return precautions discussed. Patient expresses understanding and agreement to plan.   Final Clinical Impressions(s) / ED Diagnoses   Final  diagnoses:  Trichomonosis    New Prescriptions Discharge Medication List as of 06/02/2016  7:09 PM      I personally performed the services described in this documentation, which was scribed in my presence. The recorded information has been reviewed and is accurate.    Maxwell Caul, PA-C 06/02/16 2012    Nira Conn, MD 06/03/16 [phone]

## 2016-06-02 NOTE — ED Triage Notes (Signed)
[facility], they called her back to get her medicine and she couldn't at that time Pt is only here for her prescription of flagyl

## 2016-06-02 NOTE — ED Notes (Signed)
PT DISCHARGED. INSTRUCTIONS GIVEN. AAOX4. PT IN NO APPARENT DISTRESS OR PAIN. THE OPPORTUNITY TO ASK QUESTIONS WAS PROVIDED. 

## 2017-02-27 ENCOUNTER — Other Ambulatory Visit: Payer: Self-pay

## 2017-02-27 ENCOUNTER — Emergency Department (HOSPITAL_COMMUNITY)
Admission: EM | Admit: 2017-02-27 | Discharge: 2017-02-28 | Disposition: A | Discharge status: Home / Self Care | Payer: Medicaid Other | Attending: Emergency Medicine | Admitting: Emergency Medicine

## 2017-02-27 ENCOUNTER — Encounter (HOSPITAL_COMMUNITY): Payer: Self-pay | Admitting: Emergency Medicine

## 2017-02-27 DIAGNOSIS — Z3A12 12 weeks gestation of pregnancy: Secondary | ICD-10-CM | POA: Diagnosis not present

## 2017-02-27 DIAGNOSIS — F17210 Nicotine dependence, cigarettes, uncomplicated: Secondary | ICD-10-CM | POA: Diagnosis not present

## 2017-02-27 DIAGNOSIS — O99331 Smoking (tobacco) complicating pregnancy, first trimester: Secondary | ICD-10-CM | POA: Diagnosis not present

## 2017-02-27 DIAGNOSIS — J45909 Unspecified asthma, uncomplicated: Secondary | ICD-10-CM | POA: Insufficient documentation

## 2017-02-27 DIAGNOSIS — J069 Acute upper respiratory infection, unspecified: Secondary | ICD-10-CM | POA: Diagnosis not present

## 2017-02-27 DIAGNOSIS — O9952 Diseases of the respiratory system complicating childbirth: Secondary | ICD-10-CM | POA: Diagnosis present

## 2017-02-27 MED ORDER — ALBUTEROL SULFATE (2.5 MG/3ML) 0.083% IN NEBU
5.0000 mg | INHALATION_SOLUTION | Freq: Once | RESPIRATORY_TRACT | Status: AC
  Administered 2017-02-27: 5 mg via RESPIRATORY_TRACT
  Filled 2017-02-27: qty 6

## 2017-02-27 MED ORDER — ACETAMINOPHEN 325 MG PO TABS
650.0000 mg | ORAL_TABLET | Freq: Once | ORAL | Status: AC
  Administered 2017-02-27: 650 mg via ORAL
  Filled 2017-02-27: qty 2

## 2017-02-27 NOTE — ED Triage Notes (Signed)
Pt reports cold like S/S since yesterday, pt repots cough, nasal congestion. Pt states she is 3 mo pregnant, EDD 08/23/17.

## 2017-02-27 NOTE — ED Provider Notes (Signed)
[facility] EMERGENCY DEPARTMENT Provider Note   CSN: [id number] Arrival date & time: 02/27/17  2104     History   Chief Complaint Chief Complaint  Patient presents with  . Asthma    HPI Tammy [name] is a 22 y.o. female.  Patient presents with nasal congestion, cough, wheezing without known fever for the past couple of days. She states she does not have Albuterol at home and feels she has needed it. No nausea, vomiting, diarrhea. She reports she is 3 months pregnant in an uncomplicated pregnancy, receiving prenatal care. No pelvic pain, vaginal bleeding or low back pain.    The history is provided by the patient. No language interpreter was used.  Asthma  Associated symptoms include headaches. Pertinent negatives include no chest pain and no abdominal pain.    Past Medical History:  Diagnosis Date  . Arthritis   . Asthma    used inhaler 1 wk ago  . Chlamydia infection   . History of chicken pox   . Preterm delivery     Patient Active Problem List   Diagnosis Date Noted  . NSVD (normal spontaneous vaginal delivery) 11/23/2012  . Asthma, severe persistent, poorly-controlled 06/22/2012  . Asthma, chronic 06/21/2012  . History of unprotected sex 06/21/2012  . Abdominal pregnancy with intrauterine pregnancy 06/21/2012  . Depo-Provera contraceptive status 02/11/2012  . Asthma 11/05/2011  . Postpartum care following vaginal delivery 01/13/2011  . Chlamydia trachomatis infection of other genitourinary sites 01/13/2011    Past Surgical History:  Procedure Laterality Date  . NO PAST SURGERIES      OB History    Gravida Para Term Preterm AB Living   3 2 1 1   2    SAB TAB Ectopic Multiple Live Births           2       Home Medications    Prior to Admission medications   Medication Sig Start Date End Date Taking? Authorizing Provider  Phenyleph-Doxylamine-DM-APAP (NYQUIL SEVERE COLD/FLU PO) Take 30 mLs by mouth as needed.   Yes [provider]    Family History Family History  Problem Relation Age of Onset  . Migraines Mother   . Mitral valve prolapse Mother   . Anemia Maternal Aunt   . Mitral valve prolapse Maternal Aunt   . Heart disease Paternal Uncle   . Mitral valve prolapse Maternal Grandmother   . Dementia Maternal Grandmother   . Kidney disease Paternal Grandmother        dialysis  . Cancer Paternal Grandfather        Lung    Social History Social History   Tobacco Use  . Smoking status: Current Some Day Smoker    Packs/day: 0.50    Types: Cigarettes  . Smokeless tobacco: Current User  Substance Use Topics  . Alcohol use: Yes    Comment: occ  . Drug use: No     Allergies   Pollen extract   Review of Systems Review of Systems  Constitutional: Positive for chills. Negative for fever.  HENT: Positive for congestion, sinus pressure and sneezing.   Respiratory: Positive for cough and wheezing.   Cardiovascular: Negative for chest pain.  Gastrointestinal: Negative for abdominal pain, nausea and vomiting.  Genitourinary: Negative for pelvic pain and vaginal bleeding.  Musculoskeletal: Negative for myalgias.  Skin: Negative for rash.  Neurological: Positive for headaches.     Physical Exam Updated Vital Signs BP 137/77   Pulse (!) 111  Temp (!) 100.7 F (38.2 C) (Oral)   Resp 18   Ht 5\' 2"  (1.575 m)   Wt 54.4 kg (120 lb)   LMP  (LMP Unknown)   SpO2 99%   BMI 21.95 kg/m   Physical Exam  Constitutional: She is oriented to person, place, and time. She appears well-developed and well-nourished.  HENT:  Head: Normocephalic.  Nose: Mucosal edema present.  Mouth/Throat: Oropharynx is clear and moist.  Neck: Normal range of motion. Neck supple.  Cardiovascular: Normal rate and regular rhythm.  No murmur heard. Pulmonary/Chest: Effort normal and breath sounds normal. No respiratory distress. She has no wheezes. She has no rales.  Abdominal: Soft. Bowel sounds are normal.  There is no tenderness. There is no rebound and no guarding.  Musculoskeletal: Normal range of motion.  Neurological: She is alert and oriented to person, place, and time.  Skin: Skin is warm and dry. No rash noted.  Psychiatric: She has a normal mood and affect.     ED Treatments / Results  Labs (all labs ordered are listed, but only abnormal results are displayed) Labs Reviewed - No data to display  EKG  EKG Interpretation None       Radiology No results found.  Procedures Procedures (including critical care time)  Medications Ordered in ED Medications  acetaminophen (TYLENOL) tablet 650 mg (650 mg Oral Given 02/27/17 2229)  albuterol (PROVENTIL) (2.5 MG/3ML) 0.083% nebulizer solution 5 mg (5 mg Nebulization Given 02/27/17 2229)     Initial Impression / Assessment and Plan / ED Course  I have reviewed the triage vital signs and the nursing notes.  Pertinent labs & imaging results that were available during my care of the patient were reviewed by me and considered in my medical decision making (see chart for details).     Patient here for evaluation of URI symptoms without known fever. She feels she is wheezing and would benefit from a nebulizer treatment. No wheezing, rales or rhonchi on auscultation. Nebulizer provided after which she reports she is breathing easier.   VSS. She is tachycardic in the setting of low grade fever and this is unchanged after neb treatment. Do not feel this represents PE. She is well appearing, without nausea, body aches or significant fever. Do not feel this is influenza. Will provide inhaler for home use, suggest supportive care. Return precautions discussed.  Final Clinical Impressions(s) / ED Diagnoses   Final diagnoses:  None   1. URI  ED Discharge Orders    None       Elpidio AnisUpstill, Syndey Jaskolski, PA-C 02/27/17 2359    Devoria AlbeKnapp, Iva, MD 02/28/17 0700

## 2017-02-28 MED ORDER — ALBUTEROL SULFATE HFA 108 (90 BASE) MCG/ACT IN AERS
2.0000 | INHALATION_SPRAY | RESPIRATORY_TRACT | Status: DC | PRN
  Administered 2017-02-28: 2 via RESPIRATORY_TRACT
  Filled 2017-02-28: qty 6.7

## 2017-04-24 ENCOUNTER — Encounter (HOSPITAL_COMMUNITY): Payer: Self-pay

## 2017-04-24 ENCOUNTER — Inpatient Hospital Stay (HOSPITAL_COMMUNITY)
Admission: AD | Admit: 2017-04-24 | Discharge: 2017-04-24 | Disposition: A | Discharge status: Home / Self Care | Payer: Medicaid Other | Source: Ambulatory Visit | Attending: Obstetrics and Gynecology | Admitting: Obstetrics and Gynecology

## 2017-04-24 DIAGNOSIS — O98812 Other maternal infectious and parasitic diseases complicating pregnancy, second trimester: Secondary | ICD-10-CM | POA: Diagnosis not present

## 2017-04-24 DIAGNOSIS — O99332 Smoking (tobacco) complicating pregnancy, second trimester: Secondary | ICD-10-CM | POA: Insufficient documentation

## 2017-04-24 DIAGNOSIS — F17210 Nicotine dependence, cigarettes, uncomplicated: Secondary | ICD-10-CM | POA: Insufficient documentation

## 2017-04-24 DIAGNOSIS — Z3A22 22 weeks gestation of pregnancy: Secondary | ICD-10-CM | POA: Insufficient documentation

## 2017-04-24 DIAGNOSIS — B373 Candidiasis of vulva and vagina: Secondary | ICD-10-CM | POA: Diagnosis not present

## 2017-04-24 DIAGNOSIS — R102 Pelvic and perineal pain: Secondary | ICD-10-CM | POA: Diagnosis present

## 2017-04-24 DIAGNOSIS — B3731 Acute candidiasis of vulva and vagina: Secondary | ICD-10-CM

## 2017-04-24 LAB — URINALYSIS, ROUTINE W REFLEX MICROSCOPIC
BILIRUBIN URINE: NEGATIVE
GLUCOSE, UA: NEGATIVE mg/dL
HGB URINE DIPSTICK: NEGATIVE
KETONES UR: NEGATIVE mg/dL
Nitrite: NEGATIVE
Protein, ur: NEGATIVE mg/dL
Specific Gravity, Urine: 1.019 (ref 1.005–1.030)
pH: 6 (ref 5.0–8.0)

## 2017-04-24 LAB — WET PREP, GENITAL
Clue Cells Wet Prep HPF POC: NONE SEEN
Sperm: NONE SEEN
Trich, Wet Prep: NONE SEEN

## 2017-04-24 MED ORDER — CLOTRIMAZOLE-BETAMETHASONE 1-0.05 % EX CREA: 1.0000 "application " | TOPICAL_CREAM | Freq: Two times a day (BID) | CUTANEOUS | 0 refills | Status: DC

## 2017-04-24 MED ORDER — TERCONAZOLE 80 MG VA SUPP: 80.0000 mg | Freq: Every day | VAGINAL | 0 refills | Status: AC

## 2017-04-24 NOTE — MAU Provider Note (Addendum)
  History     CSN: [id number]  Arrival date and time: 04/24/17 1850   None     Chief Complaint  Patient presents with  . Vaginal Pain   HPI Pt presents c/o right labial swelling and pain that started earlier today.  She has no other complaints and reports +FM, no VB and no ctxs.  OB History    Gravida  3   Para  2   Term  1   Preterm  1   AB      Living  2     SAB      TAB      Ectopic      Multiple      Live Births  2           Past Medical History:  Diagnosis Date  . Arthritis   . Asthma    used inhaler 1 wk ago  . Chlamydia infection   . History of chicken pox   . Preterm delivery     Past Surgical History:  Procedure Laterality Date  . NO PAST SURGERIES      Family History  Problem Relation Age of Onset  . Migraines Mother   . Mitral valve prolapse Mother   . Anemia Maternal Aunt   . Mitral valve prolapse Maternal Aunt   . Heart disease Paternal Uncle   . Mitral valve prolapse Maternal Grandmother   . Dementia Maternal Grandmother   . Kidney disease Paternal Grandmother        dialysis  . Cancer Paternal Grandfather        Lung    Social History   Tobacco Use  . Smoking status: Current Some Day Smoker    Packs/day: 0.50    Types: Cigarettes  . Smokeless tobacco: Current User  Substance Use Topics  . Alcohol use: Yes    Comment: occ  . Drug use: No    Allergies:  Allergies  Allergen Reactions  . Pollen Extract Swelling    Medications Prior to Admission  Medication Sig Dispense Refill Last Dose  . Phenyleph-Doxylamine-DM-APAP (NYQUIL SEVERE COLD/FLU PO) Take 30 mLs by mouth as needed.   02/27/2017 at Unknown time    Review of Systems  Denies F/C/N/V/D  Physical Exam   Blood pressure (!) 116/53, temperature 98.5 F (36.9 C), temperature source Oral, resp. rate 16, height 5\' 3"  (1.6 m), weight 131 lb (59.4 kg).  Physical Exam Lungs CTA CV RRR Abd soft, NT Ext no calf tenderness Spec thick yellow discharge VE  closed/long FHT 150s  MAU Course  Procedures  Wet prep +yeast and +WBC UA and Urine Cx sent  Assessment and Plan  P2 at 22 5/7wks with yeast infection.  Rxs sent to pharmacy.  Pt instructed to keep scheduled appt in the office and sooner if any other problems occur.  Purcell Nailsngela Y Trinitie Mcgirr 04/24/2017, 10:12 PM

## 2017-04-24 NOTE — Progress Notes (Addendum)
G3P2 @ 22.[redacted] wksga. Here dt perineal swelling. Thinks from shaving. Rating pain 8/10. Denies LOF or bleeding.   Doppler 152  2019: Provider notified. Report given. States will come by unit to assess pt.   Provider at bs for speculum exam, GC, wetprep and pelvic exam done.   2228: d/c orders received. D/c instructions given with pt understanding. Pt left unit via ambulatory

## 2017-04-25 LAB — GC/CHLAMYDIA PROBE AMP (~~LOC~~) NOT AT ARMC
Chlamydia: NEGATIVE
Neisseria Gonorrhea: NEGATIVE

## 2017-04-26 LAB — CULTURE, OB URINE: Culture: >=100000 — AB

## 2017-08-22 DIAGNOSIS — Z98891 History of uterine scar from previous surgery: Secondary | ICD-10-CM | POA: Insufficient documentation

## 2017-11-15 ENCOUNTER — Encounter (HOSPITAL_COMMUNITY): Payer: Self-pay

## 2018-11-01 ENCOUNTER — Emergency Department (HOSPITAL_COMMUNITY): Payer: Medicaid Other

## 2018-11-01 ENCOUNTER — Emergency Department (HOSPITAL_COMMUNITY)
Admission: EM | Admit: 2018-11-01 | Discharge: 2018-11-01 | Disposition: A | Discharge status: Home / Self Care | Payer: Medicaid Other | Attending: Emergency Medicine | Admitting: Emergency Medicine

## 2018-11-01 ENCOUNTER — Encounter (HOSPITAL_COMMUNITY): Payer: Self-pay

## 2018-11-01 ENCOUNTER — Other Ambulatory Visit: Payer: Self-pay

## 2018-11-01 ENCOUNTER — Emergency Department (HOSPITAL_COMMUNITY)
Admission: EM | Admit: 2018-11-01 | Discharge: 2018-11-01 | Discharge status: Against Medical Advice | Payer: Medicaid Other

## 2018-11-01 DIAGNOSIS — J45901 Unspecified asthma with (acute) exacerbation: Secondary | ICD-10-CM

## 2018-11-01 DIAGNOSIS — Z79899 Other long term (current) drug therapy: Secondary | ICD-10-CM | POA: Diagnosis not present

## 2018-11-01 DIAGNOSIS — F17210 Nicotine dependence, cigarettes, uncomplicated: Secondary | ICD-10-CM | POA: Insufficient documentation

## 2018-11-01 DIAGNOSIS — R0602 Shortness of breath: Secondary | ICD-10-CM | POA: Diagnosis present

## 2018-11-01 LAB — POC URINE PREG, ED: Preg Test, Ur: NEGATIVE

## 2018-11-01 MED ORDER — PREDNISONE 20 MG PO TABS
40.0000 mg | ORAL_TABLET | Freq: Once | ORAL | Status: AC
  Administered 2018-11-01: 40 mg via ORAL
  Filled 2018-11-01: qty 2

## 2018-11-01 MED ORDER — ALBUTEROL SULFATE HFA 108 (90 BASE) MCG/ACT IN AERS: 2.0000 | INHALATION_SPRAY | RESPIRATORY_TRACT | 0 refills | Status: DC | PRN

## 2018-11-01 MED ORDER — ALBUTEROL SULFATE HFA 108 (90 BASE) MCG/ACT IN AERS
2.0000 | INHALATION_SPRAY | Freq: Once | RESPIRATORY_TRACT | Status: AC
  Administered 2018-11-01: 2 via RESPIRATORY_TRACT
  Filled 2018-11-01: qty 6.7

## 2018-11-01 MED ORDER — PREDNISONE 20 MG PO TABS: 40.0000 mg | ORAL_TABLET | Freq: Every day | ORAL | 0 refills | Status: AC

## 2018-11-01 MED ORDER — ALBUTEROL SULFATE (2.5 MG/3ML) 0.083% IN NEBU
5.0000 mg | INHALATION_SOLUTION | Freq: Once | RESPIRATORY_TRACT | Status: AC
  Administered 2018-11-01: 5 mg via RESPIRATORY_TRACT
  Filled 2018-11-01: qty 6

## 2018-11-01 NOTE — ED Notes (Signed)
Pt very irate and on the phone with [employer] talking to EMS call taker asking about leaving and getting taken to another hospital.  Pt is alert and talking in full complete sentences with a raised voice while stating " I can't breathe, and I will die before I get seen. "

## 2018-11-01 NOTE — ED Provider Notes (Signed)
Tammy [name]   CSN: [id number] Arrival date & time: 11/01/18  0441     History   Chief Complaint Chief Complaint  Patient presents with  . Asthma    HPI Tammy [name] is a 23 y.o. female.      Asthma This is a recurrent problem. The current episode started yesterday. The problem occurs rarely. The problem has been gradually improving. Associated symptoms include shortness of breath. Pertinent negatives include no chest pain, no abdominal pain and no headaches. Nothing aggravates the symptoms. Relieved by: inhaler but ran out. Treatments tried: albuterol  The treatment provided mild relief.    Past Medical History:  Diagnosis Date  . Arthritis   . Asthma    used inhaler 1 wk ago  . Chlamydia infection   . History of chicken pox   . Preterm delivery     Patient Active Problem List   Diagnosis Date Noted  . NSVD (normal spontaneous vaginal delivery) 11/23/2012  . Asthma, severe persistent, poorly-controlled 06/22/2012  . Asthma, chronic 06/21/2012  . History of unprotected sex 06/21/2012  . Abdominal pregnancy with intrauterine pregnancy 06/21/2012  . Depo-Provera contraceptive status 02/11/2012  . Asthma 11/05/2011  . Postpartum care following vaginal delivery 01/13/2011  . Chlamydia trachomatis infection of other genitourinary sites 01/13/2011    Past Surgical History:  Procedure Laterality Date  . NO PAST SURGERIES       OB History    Gravida  3   Para  2   Term  1   Preterm  1   AB      Living  2     SAB      TAB      Ectopic      Multiple      Live Births  2            Home Medications    Prior to Admission medications   Medication Sig Start Date End Date Taking? Authorizing Provider  albuterol (VENTOLIN HFA) 108 (90 Base) MCG/ACT inhaler Inhale 2 puffs into the lungs every 4 (four) hours as needed for wheezing or shortness of breath. 11/01/18   Opie Fanton, DO   clotrimazole-betamethasone (LOTRISONE) cream Apply 1 application topically 2 (two) times daily. For 5 days. 04/24/17   Osborn Cohooberts, Angela, MD  predniSONE (DELTASONE) 20 MG tablet Take 2 tablets (40 mg total) by mouth daily for 4 days. 11/01/18 11/05/18  Virgina Norfolkuratolo, Akera Snowberger, DO    Family History Family History  Problem Relation Age of Onset  . Migraines Mother   . Mitral valve prolapse Mother   . Anemia Maternal Aunt   . Mitral valve prolapse Maternal Aunt   . Heart disease Paternal Uncle   . Mitral valve prolapse Maternal Grandmother   . Dementia Maternal Grandmother   . Kidney disease Paternal Grandmother        dialysis  . Cancer Paternal Grandfather        Lung    Social History Social History   Tobacco Use  . Smoking status: Current Some Day Smoker    Packs/day: 0.50    Types: Cigarettes  . Smokeless tobacco: Current User  Substance Use Topics  . Alcohol use: Yes    Comment: occ  . Drug use: No     Allergies   Pollen extract   Review of Systems Review of Systems  Constitutional: Negative for chills and fever.  HENT: Negative for ear pain and sore throat.  Eyes: Negative for pain and visual disturbance.  Respiratory: Positive for shortness of breath and wheezing. Negative for cough.   Cardiovascular: Negative for chest pain and palpitations.  Gastrointestinal: Negative for abdominal pain and vomiting.  Genitourinary: Negative for dysuria and hematuria.  Musculoskeletal: Negative for arthralgias and back pain.  Skin: Negative for color change and rash.  Neurological: Negative for seizures, syncope and headaches.  All other systems reviewed and are negative.    Physical Exam Updated Vital Signs  Physical Exam Vitals signs and nursing [name] reviewed.  Constitutional:      General: She is not in acute distress.    Appearance: She is well-developed.  HENT:     Head: Normocephalic and atraumatic.     Nose: Nose normal.     Mouth/Throat:     Mouth: Mucous  membranes are moist.  Eyes:     Extraocular Movements: Extraocular movements intact.     Conjunctiva/sclera: Conjunctivae normal.     Pupils: Pupils are equal, round, and reactive to light.  Neck:     Musculoskeletal: Normal range of motion and neck supple.  Cardiovascular:     Rate and Rhythm: Normal rate and regular rhythm.     Pulses: Normal pulses.     Heart sounds: Normal heart sounds. No murmur.  Pulmonary:     Effort: Pulmonary effort is normal. No respiratory distress.     Breath sounds: Normal breath sounds. No stridor. No wheezing or rhonchi.  Abdominal:     General: Abdomen is flat.     Palpations: Abdomen is soft.     Tenderness: There is no abdominal tenderness.  Skin:    General: Skin is warm and dry.     Capillary Refill: Capillary refill takes less than 2 seconds.  Neurological:     General: No focal deficit present.     Mental Status: She is alert.      ED Treatments / Results  Labs (all labs ordered are listed, but only abnormal results are displayed) Labs Reviewed - No data to display  EKG None  Radiology Dg Chest 2 View  Result Date: 11/01/2018 CLINICAL DATA:  Asthma, new onset shortness of breath EXAM: CHEST - 2 VIEW COMPARISON:  Radiograph September 09, 2014 FINDINGS: Mild airways thickening. No consolidation, features of edema, pneumothorax, or effusion. Pulmonary vascularity is normally distributed. The cardiomediastinal contours are unremarkable. No acute osseous or soft tissue abnormality. IMPRESSION: Airway thickening consistent with airways inflammation/reactive airways disease. No consolidation. Electronically Signed   By: Lovena Le M.D.   On: 11/01/2018 06:51    Procedures Procedures (including critical care time)  Medications Ordered in ED Medications  albuterol (PROVENTIL) (2.5 MG/3ML) 0.083% nebulizer solution 5 mg (5 mg Nebulization Given 11/01/18 0627)  albuterol (VENTOLIN HFA) 108 (90 Base) MCG/ACT inhaler 2 puff (2 puffs Inhalation  Given 11/01/18 0718)  predniSONE (DELTASONE) tablet 40 mg (40 mg Oral Given 11/01/18 7673)     Initial Impression / Assessment and Plan / ED Course  I have reviewed the triage vital signs and the nursing notes.  Pertinent labs & imaging results that were available during my care of the patient were reviewed by me and considered in my medical decision making (see chart for details).     Tammy [name] is a 23 year old female history of asthma who presents the ED with wheezing.  Patient states that she ran out of her albuterol inhaler at home.  Has had some cough but no fever.  Chest x-ray shows  some reactive airway disease.  No pneumothorax, no pneumonia.  Patient had a nebulizer treatment prior to my evaluation and no longer has any wheezing or respiratory distress on exam.  Will give albuterol inhaler and steroids.  She denies being pregnant.  Recommend continued use of albuterol and steroids at home.  Told to return to the ED if symptoms worsen.  This chart was dictated using voice recognition software.  Despite best efforts to proofread,  errors can occur which can change the documentation meaning.    Final Clinical Impressions(s) / ED Diagnoses   Final diagnoses:  Mild asthma with exacerbation, unspecified whether persistent    ED Discharge Orders         Ordered    albuterol (VENTOLIN HFA) 108 (90 Base) MCG/ACT inhaler  Every 4 hours PRN,   Status:  Discontinued     11/01/18 0712    predniSONE (DELTASONE) 20 MG tablet  Daily     11/01/18 0712    albuterol (VENTOLIN HFA) 108 (90 Base) MCG/ACT inhaler  Every 4 hours PRN     11/01/18 0712           Virgina Norfolk, DO 11/01/18 6283

## 2018-11-01 NOTE — ED Triage Notes (Signed)
Pt complains of an asthma attack, she was at [facility] and left and came here only to yell and cuss at everyone from registration to nursing Pt is passing sufficient air because she can yell and continuously talk to [place]

## 2019-01-12 ENCOUNTER — Encounter (HOSPITAL_COMMUNITY): Payer: Self-pay

## 2019-01-12 ENCOUNTER — Other Ambulatory Visit: Payer: Self-pay

## 2019-01-12 ENCOUNTER — Emergency Department (HOSPITAL_COMMUNITY)
Admission: EM | Admit: 2019-01-12 | Discharge: 2019-01-12 | Disposition: A | Discharge status: Home / Self Care | Payer: Medicaid Other | Attending: Emergency Medicine | Admitting: Emergency Medicine

## 2019-01-12 DIAGNOSIS — J4541 Moderate persistent asthma with (acute) exacerbation: Secondary | ICD-10-CM | POA: Diagnosis not present

## 2019-01-12 DIAGNOSIS — R0602 Shortness of breath: Secondary | ICD-10-CM | POA: Diagnosis present

## 2019-01-12 DIAGNOSIS — F17210 Nicotine dependence, cigarettes, uncomplicated: Secondary | ICD-10-CM | POA: Diagnosis not present

## 2019-01-12 DIAGNOSIS — J45901 Unspecified asthma with (acute) exacerbation: Secondary | ICD-10-CM

## 2019-01-12 MED ORDER — IPRATROPIUM-ALBUTEROL 0.5-2.5 (3) MG/3ML IN SOLN: 3.0000 mL | Freq: Once | RESPIRATORY_TRACT | Status: DC

## 2019-01-12 MED ORDER — PREDNISONE 20 MG PO TABS: 40.0000 mg | ORAL_TABLET | Freq: Every day | ORAL | 0 refills | Status: DC

## 2019-01-12 MED ORDER — METHYLPREDNISOLONE SODIUM SUCC 125 MG IJ SOLR: 125.0000 mg | Freq: Once | INTRAMUSCULAR | Status: DC

## 2019-01-12 MED ORDER — ALBUTEROL SULFATE HFA 108 (90 BASE) MCG/ACT IN AERS: 6.0000 | INHALATION_SPRAY | Freq: Once | RESPIRATORY_TRACT | Status: DC

## 2019-01-12 MED ORDER — MAGNESIUM SULFATE 2 GM/50ML IV SOLN: 2.0000 g | INTRAVENOUS | Status: DC

## 2019-01-12 MED ORDER — ALBUTEROL SULFATE HFA 108 (90 BASE) MCG/ACT IN AERS
8.0000 | INHALATION_SPRAY | Freq: Once | RESPIRATORY_TRACT | Status: AC
  Administered 2019-01-12: 06:00:00 8 via RESPIRATORY_TRACT
  Filled 2019-01-12: qty 6.7

## 2019-01-12 NOTE — ED Notes (Signed)
This Probation officer entered pt room with ordered medication and supplies to start IV, and walked into pt yelling and shouting stating "fuck [place], these bitches are going to let me die". I explained to pt that I had both an inhaler for her and IV steroid medication that would help the symptoms she was claiming to have along with a COVID swab to perform. I further explained that the COVID swab was to confirm that she was negative for COVID so a nebulizer treatment could then be considered but pt stated "I didn't come here to be stuck, I came for a damn nebulizer treatment". Pt agreed to use inhaler that was provideed to her and took the prescribed 8 puffs. Pt then continued to yell and curse at myself and at the phone she was also talking on. I again tried to explain the benefits of the IV steroids to her and was yelled at by pt stating "none of you give a damn about me, you are going to let me sit here for 2 hours and die, I don't want a fucking IV, to get stuck, or a damn COVID test, I already had a COVID test that was negative". Pt then stated "I want discharge papers", I for a final time tried to explain the benefits of the IV meds and the COVID test and pt repeated that she wanted discharge paperwork. I found Rob PA and explained the situation to him which in turn he printed discharge paperwork and a prescription for the pt. Upon returning to pt room pt had left and been escorted out by security. I found pt in lobby of ED and gave her the discharge paperwork and her prescription and explained the importance of taking the prescribed steroids. Pt verbalized understanding of this and was last seen standing in lobby waiting for a ride.

## 2019-01-12 NOTE — ED Provider Notes (Signed)
[facility] DEPT Provider Note   CSN: [id number] Arrival date & time: 01/12/19  3500     History Chief Complaint  Patient presents with  . Shortness of Breath    Tammy [name] is a 23 y.o. female.  Patient presents to the emergency department with a chief complaint of asthma exacerbation.  She states she has run out of her inhaler.  States the symptoms started tonight.  Denies fever.  Denies productive cough.  No successful treatments prior to arrival.  No known exacerbating factors.  Patient is ~[redacted] weeks pregnant, but with no complaints.   The history is provided by the patient. No language interpreter was used.       Past Medical History:  Diagnosis Date  . Arthritis   . Asthma    used inhaler 1 wk ago  . Chlamydia infection   . History of chicken pox   . Preterm delivery     Patient Active Problem List   Diagnosis Date Noted  . NSVD (normal spontaneous vaginal delivery) 11/23/2012  . Asthma, severe persistent, poorly-controlled 06/22/2012  . Asthma, chronic 06/21/2012  . History of unprotected sex 06/21/2012  . Abdominal pregnancy with intrauterine pregnancy 06/21/2012  . Depo-Provera contraceptive status 02/11/2012  . Asthma 11/05/2011  . Postpartum care following vaginal delivery 01/13/2011  . Chlamydia trachomatis infection of other genitourinary sites 01/13/2011    Past Surgical History:  Procedure Laterality Date  . NO PAST SURGERIES       OB History    Gravida  3   Para  2   Term  1   Preterm  1   AB      Living  2     SAB      TAB      Ectopic      Multiple      Live Births  2           Family History  Problem Relation Age of Onset  . Migraines Mother   . Mitral valve prolapse Mother   . Anemia Maternal Aunt   . Mitral valve prolapse Maternal Aunt   . Heart disease Paternal Uncle   . Mitral valve prolapse Maternal Grandmother   . Dementia Maternal Grandmother   . Kidney disease Paternal  Grandmother        dialysis  . Cancer Paternal Grandfather        Lung    Social History   Tobacco Use  . Smoking status: Current Some Day Smoker    Packs/day: 0.50    Types: Cigarettes  . Smokeless tobacco: Current User  Substance Use Topics  . Alcohol use: Yes    Comment: occ  . Drug use: No    Home Medications Prior to Admission medications   Medication Sig Start Date End Date Taking? Authorizing Provider  albuterol (VENTOLIN HFA) 108 (90 Base) MCG/ACT inhaler Inhale 2 puffs into the lungs every 4 (four) hours as needed for wheezing or shortness of breath. 11/01/18   Curatolo, Adam, DO  clotrimazole-betamethasone (LOTRISONE) cream Apply 1 application topically 2 (two) times daily. For 5 days. 04/24/17   Everett Graff, MD    Allergies    Pollen extract  Review of Systems   Review of Systems  All other systems reviewed and are negative.   Physical Exam Updated Vital Signs BP (!) 107/92 (BP Location: Left Arm)   Pulse 78   Temp 98.8 F (37.1 C) (Oral)   Resp (!) 24  Ht 5\' 3"  (1.6 m)   Wt 51.7 kg   SpO2 94%   BMI 20.19 kg/m   Physical Exam Vitals and nursing note reviewed.  Constitutional:      General: She is not in acute distress.    Appearance: She is well-developed.  HENT:     Head: Normocephalic and atraumatic.  Eyes:     Conjunctiva/sclera: Conjunctivae normal.  Cardiovascular:     Rate and Rhythm: Normal rate and regular rhythm.     Heart sounds: No murmur.  Pulmonary:     Effort: Respiratory distress present.     Breath sounds: Wheezing present.     Comments: Moderate respiratory distress, speaks in 2-3 word phrases Abdominal:     Palpations: Abdomen is soft.     Tenderness: There is no abdominal tenderness.  Musculoskeletal:     Cervical back: Neck supple.  Skin:    General: Skin is warm and dry.  Neurological:     Mental Status: She is alert.     ED Results / Procedures / Treatments   Labs (all labs ordered are listed, but only  abnormal results are displayed) Labs Reviewed  POC SARS CORONAVIRUS 2 AG -  ED    EKG None  Radiology No results found.  Procedures Procedures (including critical care time)  Medications Ordered in ED Medications  ipratropium-albuterol (DUONEB) 0.5-2.5 (3) MG/3ML nebulizer solution 3 mL (has no administration in time range)  magnesium sulfate IVPB 2 g 50 mL (has no administration in time range)  albuterol (VENTOLIN HFA) 108 (90 Base) MCG/ACT inhaler 8 puff (has no administration in time range)    ED Course  I have reviewed the triage vital signs and the nursing notes.  Pertinent labs & imaging results that were available during my care of the patient were reviewed by me and considered in my medical decision making (see chart for details).    MDM Rules/Calculators/A&P                      Patient with significant asthma exacerbation.  Reports symptoms started tonight.  She has run out of her inhaler.  She has diffuse wheezing.  She is in moderate respiratory distress.  Will give albuterol, Solu-Medrol, magnesium.  Placed on cardiac monitor.  Will likely need multiple rounds of albuterol.  6:21 AM Patient requesting discharge.  States that if she can't get a breathing treatment she wants to go.  She has been told we can nebulize during the pandemic.  She is give prednisone for home.   Refusing any additional treatment.  Final Clinical Impression(s) / ED Diagnoses Final diagnoses:  Moderate asthma with exacerbation, unspecified whether persistent    Rx / DC Orders ED Discharge Orders         Ordered    predniSONE (DELTASONE) 20 MG tablet  Daily     01/12/19 0619           01/14/19, PA-C 01/12/19 01/14/19, MD 01/13/19 0157

## 2019-01-12 NOTE — ED Notes (Signed)
Patient is being really rude and cussing out staff. Try to talk to patient and explained that the md and nurses was in an emergency. Patient started cussing and stating she will beat people ass. Nurse asked patient to please calm down and patient got worse. Security called to talk to patient. Primary nurse went to give patient the medications that was ordered.

## 2019-01-12 NOTE — ED Triage Notes (Addendum)
Pt coming c/o shortness of breath that started a few hours ago. Hx of asthma. Ran out of inhaler tonight. Ambulatory and talking in complete sentences in triage. Pt also stated she was pregnant but not having any complications.

## 2019-01-24 ENCOUNTER — Telehealth: Payer: Self-pay | Admitting: *Deleted

## 2019-01-24 NOTE — Telephone Encounter (Signed)
Received referral fax for appointment to start prenatal care and note stating to please review ob labs confirming pregnancy. I reviewed labs and chart in care everywhere with Vonzella Nipple, PA. Plan to keep new ob intake as scheduled ; but first call patient ask if she is doing ok- if bleeding, pain, etc refer to MAU. I called [place] at mobile number twice and heard message call cannot be completed at this time. I called other home number and heard busy signal. Will place note on new ob intake appt.  Diondre Pulis,RN

## 2019-01-31 ENCOUNTER — Ambulatory Visit (INDEPENDENT_AMBULATORY_CARE_PROVIDER_SITE_OTHER): Payer: Medicaid Other | Admitting: *Deleted

## 2019-01-31 ENCOUNTER — Other Ambulatory Visit: Payer: Self-pay | Admitting: Obstetrics and Gynecology

## 2019-01-31 ENCOUNTER — Encounter (HOSPITAL_COMMUNITY): Payer: Self-pay

## 2019-01-31 ENCOUNTER — Ambulatory Visit (HOSPITAL_COMMUNITY)
Admission: EM | Admit: 2019-01-31 | Discharge: 2019-01-31 | Disposition: A | Discharge status: Home / Self Care | Payer: Medicaid Other | Attending: Family Medicine | Admitting: Family Medicine

## 2019-01-31 ENCOUNTER — Other Ambulatory Visit: Payer: Self-pay

## 2019-01-31 DIAGNOSIS — O09899 Supervision of other high risk pregnancies, unspecified trimester: Secondary | ICD-10-CM | POA: Insufficient documentation

## 2019-01-31 DIAGNOSIS — O0990 Supervision of high risk pregnancy, unspecified, unspecified trimester: Secondary | ICD-10-CM

## 2019-01-31 DIAGNOSIS — J4541 Moderate persistent asthma with (acute) exacerbation: Secondary | ICD-10-CM

## 2019-01-31 DIAGNOSIS — Z8759 Personal history of other complications of pregnancy, childbirth and the puerperium: Secondary | ICD-10-CM | POA: Insufficient documentation

## 2019-01-31 DIAGNOSIS — J45909 Unspecified asthma, uncomplicated: Secondary | ICD-10-CM

## 2019-01-31 DIAGNOSIS — O99519 Diseases of the respiratory system complicating pregnancy, unspecified trimester: Secondary | ICD-10-CM | POA: Insufficient documentation

## 2019-01-31 MED ORDER — ALBUTEROL SULFATE HFA 108 (90 BASE) MCG/ACT IN AERS: 1.0000 | INHALATION_SPRAY | Freq: Four times a day (QID) | RESPIRATORY_TRACT | 2 refills | Status: DC | PRN

## 2019-01-31 MED ORDER — ALBUTEROL SULFATE HFA 108 (90 BASE) MCG/ACT IN AERS: 2.0000 | INHALATION_SPRAY | RESPIRATORY_TRACT | 3 refills | Status: DC | PRN

## 2019-01-31 MED ORDER — BLOOD PRESSURE KIT DEVI: 1.0000 | 0 refills | Status: DC | PRN

## 2019-01-31 NOTE — Progress Notes (Signed)
I connected with  Tammy [name] on 01/31/19 at  2:30 PM EST by telephone and verified that I am speaking with the correct person using two identifiers.   I discussed the limitations, risks, security and privacy concerns of performing an evaluation and management service by telephone and the availability of in person appointments. I also discussed with the patient that there may be a patient responsible charge related to this service. The patient expressed understanding and agreed to proceed.  Explained I am completing her New OB Intake today. We discussed Her EDD and that it is based on  sure LMP . She was referred to [place] by [facility]. She confirmed she has not  Had any bleeding since her visit to [facility]. I reviewed her allergies, meds, OB History, Medical /Surgical history, and appropriate screenings. I informed her of [facility] services in our office if needed. She asked for albuterol inhaler refill because she ran out today. I explained I cannot refill; but I will forward her request to provider.   I explained I will send her the [employer] app- app sent to her while on phone.  I explained we will send a blood pressure cuff to [facility] that will fill that prescription. She would like to pick it up. We called [facility] to verify order .  I asked her to bring the blood pressure cuff with her to her first ob appointment so we can show her how to use it. Explained  then we will have her take her blood pressure weekly and enter into the app. Explained she will have some visits in office and some virtually. She already has MyChart app with [employer] and I gave her the MyChart help desk phone number so she can link [name] MyChart. I reviewed her new ob  appointment date/ time with her , our location and to wear mask, no visitors.  I explained she will have a pelvic exam, ob bloodwork, hemoglobin a1C, cbg , genetic testing if desired,- she does want a panorama,  pap if needed. I scheduled an [place] at  19 weeks and gave her the appointment. She voices understanding.   At end of visit I informed [facility] Dr[facility]Constant has sent in refill of albuterol but it was printed- I verified I will call pharmacy to be sure they received the order. I did call after call ended.   Suren Payne,RN 01/31/2019  2:37 PM

## 2019-01-31 NOTE — Patient Instructions (Signed)

## 2019-01-31 NOTE — ED Triage Notes (Signed)
Pt c/o sob 2/2 asthma onset this a.m. Ran out of albuterol. Speaking full sentences w/o difficulty, no acute distress noted.

## 2019-02-01 NOTE — Progress Notes (Signed)
Patient seen and assessed by nursing staff during this encounter. I have reviewed the chart and agree with the documentation and plan.  Catalina Antigua, MD 02/01/2019 3:59 PM

## 2019-02-03 NOTE — ED Provider Notes (Signed)
[place]   [id number] 01/31/19 Arrival Time: 9767  ASSESSMENT & PLAN:  1. Moderate persistent asthma with acute exacerbation     Declines Rx prednisone even though I feel this would benefit her.  Meds ordered this encounter  Medications  . albuterol (VENTOLIN HFA) 108 (90 Base) MCG/ACT inhaler    Sig: Inhale 1-2 puffs into the lungs every 6 (six) hours as needed for wheezing or shortness of breath.    Dispense:  18 g    Refill:  2    No indication for chest imaging at this time. Asthma precautions given. OTC symptom care as needed.  Recommend: Follow-up Information    Schedule an appointment as soon as possible for a visit  with Primary Care at [facility].   Specialty: Family Medicine Contact information: [address] [place] [phone]          Reviewed expectations re: course of current medical issues. Questions answered. Outlined signs and symptoms indicating need for more acute intervention. Patient verbalized understanding. After Visit Summary given.  SUBJECTIVE: History from: patient.  Tammy [name] is a 24 y.o. female who presents with complaint of fairly persistent chest tightness and wheezing. Onset gradual, mostly over the past week; questions longer. Triggers: not identified. Describes wheezing as moderate when present. Fever: no. Overall normal PO intake without n/v. Sick contacts: no. Ambulatory without difficulty. No LE edema. Typically her asthma is well controlled. Inhaler use: daily over the past week; out; requests refill. OTC treatment: none reported.    Social History   Tobacco Use  Smoking Status Current Some Day Smoker  . Packs/day: 0.25  . Types: Cigarettes  Smokeless Tobacco Never Used    ROS: As per HPI.   OBJECTIVE:  Vitals:   01/31/19 1805 01/31/19 1807  BP: (!) 99/57 98/61  Pulse: 86   Resp: 18   Temp: 98.4 F (36.9 C)   TempSrc: Oral   SpO2: 92% 97%       General appearance: alert; NAD HEENT: [place]; AT; without nasal congestion Neck: supple without LAD Cv: RRR without murmer Lungs: unlabored respirations, moderate bilateral expiratory wheezing; cough: absent; no significant respiratory distress Skin: warm and dry Psychological: alert and cooperative; normal mood and affect    Allergies  Allergen Reactions  . Pollen Extract Swelling    Past Medical History:  Diagnosis Date  . Arthritis   . Asthma    used inhaler 1 wk ago  . Chlamydia infection   . History of chicken pox   . Preterm delivery    Family History  Problem Relation Age of Onset  . Migraines Mother   . Mitral valve prolapse Mother   . Anemia Maternal Aunt   . Mitral valve prolapse Maternal Aunt   . Heart disease Paternal Uncle   . Mitral valve prolapse Maternal Grandmother   . Dementia Maternal Grandmother   . Kidney disease Paternal Grandmother        dialysis  . Cancer Paternal Grandfather        Lung   Social History   Socioeconomic History  . Marital status: Single    Spouse name: Not on file  . Number of children: Not on file  . Years of education: Not on file  . Highest education level: Not on file  Occupational History  . Not on file  Tobacco Use  . Smoking status: Current Some Day Smoker    Packs/day: 0.25    Types: Cigarettes  .  Smokeless tobacco: Never Used  Substance and Sexual Activity  . Alcohol use: Not Currently    Comment: occ  . Drug use: No  . Sexual activity: Yes    Birth control/protection: None  Other Topics Concern  . Not on file  Social History Narrative   ** Merged History Encounter **       Social Determinants of Health   Financial Resource Strain:   . Difficulty of Paying Living Expenses: Not on file  Food Insecurity: No Food Insecurity  . Worried About Programme researcher, broadcasting/film/video in the Last Year: Never true  . Ran Out of Food in the Last Year: Never true  Transportation Needs: No Transportation Needs  . Lack of  Transportation (Medical): No  . Lack of Transportation (Non-Medical): No  Physical Activity:   . Days of Exercise per Week: Not on file  . Minutes of Exercise per Session: Not on file  Stress:   . Feeling of Stress : Not on file  Social Connections:   . Frequency of Communication with Friends and Family: Not on file  . Frequency of Social Gatherings with Friends and Family: Not on file  . Attends Religious Services: Not on file  . Active Member of Clubs or Organizations: Not on file  . Attends Banker Meetings: Not on file  . Marital Status: Not on file  Intimate Partner Violence:   . Fear of Current or Ex-Partner: Not on file  . Emotionally Abused: Not on file  . Physically Abused: Not on file  . Sexually Abused: Not on file            Mardella Layman, MD 02/03/19 [phone]

## 2019-02-05 ENCOUNTER — Encounter: Payer: Self-pay | Admitting: *Deleted

## 2019-02-06 ENCOUNTER — Encounter: Payer: Self-pay | Admitting: *Deleted

## 2019-02-06 DIAGNOSIS — Z8759 Personal history of other complications of pregnancy, childbirth and the puerperium: Secondary | ICD-10-CM

## 2019-02-06 DIAGNOSIS — O99519 Diseases of the respiratory system complicating pregnancy, unspecified trimester: Secondary | ICD-10-CM

## 2019-02-06 DIAGNOSIS — O09899 Supervision of other high risk pregnancies, unspecified trimester: Secondary | ICD-10-CM

## 2019-02-06 DIAGNOSIS — J45909 Unspecified asthma, uncomplicated: Secondary | ICD-10-CM

## 2019-02-06 DIAGNOSIS — O0990 Supervision of high risk pregnancy, unspecified, unspecified trimester: Secondary | ICD-10-CM

## 2019-02-13 ENCOUNTER — Other Ambulatory Visit: Payer: Self-pay

## 2019-02-13 ENCOUNTER — Inpatient Hospital Stay (HOSPITAL_COMMUNITY): Payer: Medicaid Other

## 2019-02-13 ENCOUNTER — Encounter (HOSPITAL_COMMUNITY): Payer: Self-pay | Admitting: Family Medicine

## 2019-02-13 ENCOUNTER — Inpatient Hospital Stay (HOSPITAL_COMMUNITY)
Admission: AD | Admit: 2019-02-13 | Discharge: 2019-02-14 | Disposition: A | Discharge status: Home / Self Care | Payer: Medicaid Other | Attending: Family Medicine | Admitting: Family Medicine

## 2019-02-13 DIAGNOSIS — J45909 Unspecified asthma, uncomplicated: Secondary | ICD-10-CM | POA: Diagnosis not present

## 2019-02-13 DIAGNOSIS — Z8759 Personal history of other complications of pregnancy, childbirth and the puerperium: Secondary | ICD-10-CM

## 2019-02-13 DIAGNOSIS — O034 Incomplete spontaneous abortion without complication: Secondary | ICD-10-CM

## 2019-02-13 DIAGNOSIS — O99331 Smoking (tobacco) complicating pregnancy, first trimester: Secondary | ICD-10-CM | POA: Diagnosis not present

## 2019-02-13 DIAGNOSIS — Z3A12 12 weeks gestation of pregnancy: Secondary | ICD-10-CM | POA: Insufficient documentation

## 2019-02-13 DIAGNOSIS — O209 Hemorrhage in early pregnancy, unspecified: Secondary | ICD-10-CM | POA: Diagnosis not present

## 2019-02-13 DIAGNOSIS — O26891 Other specified pregnancy related conditions, first trimester: Secondary | ICD-10-CM | POA: Diagnosis not present

## 2019-02-13 DIAGNOSIS — O3680X0 Pregnancy with inconclusive fetal viability, not applicable or unspecified: Secondary | ICD-10-CM | POA: Diagnosis not present

## 2019-02-13 DIAGNOSIS — O26899 Other specified pregnancy related conditions, unspecified trimester: Secondary | ICD-10-CM

## 2019-02-13 DIAGNOSIS — R102 Pelvic and perineal pain: Secondary | ICD-10-CM | POA: Diagnosis not present

## 2019-02-13 DIAGNOSIS — Z79899 Other long term (current) drug therapy: Secondary | ICD-10-CM | POA: Diagnosis not present

## 2019-02-13 DIAGNOSIS — F17210 Nicotine dependence, cigarettes, uncomplicated: Secondary | ICD-10-CM | POA: Diagnosis not present

## 2019-02-13 DIAGNOSIS — O99511 Diseases of the respiratory system complicating pregnancy, first trimester: Secondary | ICD-10-CM | POA: Insufficient documentation

## 2019-02-13 DIAGNOSIS — O0990 Supervision of high risk pregnancy, unspecified, unspecified trimester: Secondary | ICD-10-CM

## 2019-02-13 DIAGNOSIS — O09899 Supervision of other high risk pregnancies, unspecified trimester: Secondary | ICD-10-CM

## 2019-02-13 LAB — WET PREP, GENITAL
Clue Cells Wet Prep HPF POC: NONE SEEN
Sperm: NONE SEEN
Trich, Wet Prep: NONE SEEN
Yeast Wet Prep HPF POC: NONE SEEN

## 2019-02-13 LAB — CBC
HCT: 37.1 % (ref 36.0–46.0)
Hemoglobin: 12.3 g/dL (ref 12.0–15.0)
MCH: 28.6 pg (ref 26.0–34.0)
MCHC: 33.2 g/dL (ref 30.0–36.0)
MCV: 86.3 fL (ref 80.0–100.0)
Platelets: 247 10*3/uL (ref 150–400)
RBC: 4.3 MIL/uL (ref 3.87–5.11)
RDW: 15.7 % — ABNORMAL HIGH (ref 11.5–15.5)
WBC: 9.4 10*3/uL (ref 4.0–10.5)
nRBC: 0 % (ref 0.0–0.2)

## 2019-02-13 MED ORDER — IBUPROFEN 600 MG PO TABS
600.0000 mg | ORAL_TABLET | Freq: Once | ORAL | Status: AC
  Administered 2019-02-13: 600 mg via ORAL
  Filled 2019-02-13: qty 1

## 2019-02-13 NOTE — MAU Provider Note (Signed)
Chief Complaint: Abdominal Pain   First Provider Initiated Contact with Patient 02/13/19 2233        SUBJECTIVE HPI: Tammy [name] is a 24 y.o. [id number] at 58w3dby LMP who presents to maternity admissions reporting pelvic pain and heavy  Bleeding today.  [facility] [place]. Has a new OB appointment tomorrow in our clinic. .Marland KitchenShe denies vaginal itching/burning, urinary symptoms, h/a, dizziness, n/v, or fever/chills.    Abdominal Pain This is a new problem. The current episode started today. The problem occurs intermittently. The problem has been unchanged. The pain is moderate. The quality of the pain is cramping. The abdominal pain does not radiate. Pertinent negatives include no constipation, diarrhea, dysuria, fever, myalgias, nausea or vomiting. Nothing aggravates the pain. The pain is relieved by nothing. She has tried nothing for the symptoms.  Vaginal Bleeding The patient's primary symptoms include pelvic pain and vaginal bleeding. The patient's pertinent negatives include no genital itching, genital lesions or genital odor. This is a new problem. The current episode started today. The problem occurs intermittently. The problem has been unchanged. The pain is moderate. She is pregnant. Associated symptoms include abdominal pain. Pertinent negatives include no constipation, diarrhea, dysuria, fever, nausea or vomiting. The vaginal discharge was bloody. The vaginal bleeding is heavier than menses. She has been passing clots. She has not been passing tissue. Nothing aggravates the symptoms. She has tried nothing for the symptoms.   RN Note Tammy RUBLEis a 24y.o. at 158w3dere in MAU reporting: abdominal pain and heavy vaginal bleeding that began around 2100.   Pain score: 10  Past Medical History:  Diagnosis Date  . Arthritis   . Asthma    used inhaler 1 wk ago  . Chlamydia infection   . History of chicken pox   . Preterm delivery     Past Surgical History:  Procedure Laterality Date  . CESAREAN SECTION    . NO PAST SURGERIES     Social History   Socioeconomic History  . Marital status: Single    Spouse name: Not on file  . Number of children: Not on file  . Years of education: Not on file  . Highest education level: Not on file  Occupational History  . Not on file  Tobacco Use  . Smoking status: Current Some Day Smoker    Packs/day: 0.25    Types: Cigarettes  . Smokeless tobacco: Never Used  Substance and Sexual Activity  . Alcohol use: Not Currently    Comment: occ  . Drug use: No  . Sexual activity: Yes    Birth control/protection: None  Other Topics Concern  . Not on file  Social History Narrative   ** Merged History Encounter **       Social Determinants of Health   Financial Resource Strain:   . Difficulty of Paying Living Expenses: Not on file  Food Insecurity: No Food Insecurity  . Worried About RuCharity fundraisern the Last Year: Never true  . Ran Out of Food in the Last Year: Never true  Transportation Needs: No Transportation Needs  . Lack of Transportation (Medical): No  . Lack of Transportation (Non-Medical): No  Physical Activity:   . Days of Exercise per Week: Not on file  . Minutes of Exercise per Session: Not on file  Stress:   . Feeling of Stress : Not on file  Social Connections:   .  Frequency of Communication with Friends and Family: Not on file  . Frequency of Social Gatherings with Friends and Family: Not on file  . Attends Religious Services: Not on file  . Active Member of Clubs or Organizations: Not on file  . Attends Archivist Meetings: Not on file  . Marital Status: Not on file  Intimate Partner Violence:   . Fear of Current or Ex-Partner: Not on file  . Emotionally Abused: Not on file  . Physically Abused: Not on file  . Sexually Abused: Not on file   No current facility-administered medications on file prior to encounter.   Current  Outpatient Medications on File Prior to Encounter  Medication Sig Dispense Refill  . albuterol (VENTOLIN HFA) 108 (90 Base) MCG/ACT inhaler Inhale 1-2 puffs into the lungs every 6 (six) hours as needed for wheezing or shortness of breath. 18 g 2  . Blood Pressure Monitoring (BLOOD PRESSURE KIT) DEVI 1 Device by Does not apply route as needed. 1 each 0  . clotrimazole-betamethasone (LOTRISONE) cream Apply 1 application topically 2 (two) times daily. For 5 days. (Patient not taking: Reported on 01/12/2019) 30 g 0   Allergies  Allergen Reactions  . Pollen Extract Swelling    I have reviewed patient's Past Medical Hx, Surgical Hx, Family Hx, Social Hx, medications and allergies.   ROS:  Review of Systems  Constitutional: Negative for fever.  Gastrointestinal: Positive for abdominal pain. Negative for constipation, diarrhea, nausea and vomiting.  Genitourinary: Positive for pelvic pain and vaginal bleeding. Negative for dysuria.  Musculoskeletal: Negative for myalgias.   Review of Systems  Other systems negative   Physical Exam  Physical Exam Patient Vitals for the past 24 hrs:  BP Temp Temp src Pulse Resp SpO2 Height Weight  02/13/19 2222 134/69 98 F (36.7 C) Oral 71 16 98 % _0  (1.6 m) --  02/13/19 2215 -- -- -- -- -- -- -- 55.4 kg   Constitutional: Well-developed, well-nourished female in no acute distress.  Cardiovascular: normal rate Respiratory: normal effort GI: Abd soft, non-tender. Pos BS x 4 MS: Extremities nontender, no edema, normal ROM Neurologic: Alert and oriented x 4.  GU: Neg CVAT.  PELVIC EXAM: Cervix pink, visually closed, without lesion, moderate red blood in vault., vaginal walls and external genitalia normal Bimanual exam: Cervix 0/long/high, firm, anterior, neg CMT, uterus nontender, nonenlarged, adnexa without tenderness, enlargement, or mass    LAB RESULTS Results for orders placed or performed during the hospital encounter of 02/13/19 (from the  past 24 hour(s))  Wet prep, genital     Status: Abnormal   Collection Time: 02/13/19 10:45 PM   Specimen: PATH Cytology Cervicovaginal Ancillary Only  Result Value Ref Range   Yeast Wet Prep HPF POC NONE SEEN NONE SEEN   Trich, Wet Prep NONE SEEN NONE SEEN   Clue Cells Wet Prep HPF POC NONE SEEN NONE SEEN   WBC, Wet Prep HPF POC MODERATE (A) NONE SEEN   Sperm NONE SEEN   CBC     Status: Abnormal   Collection Time: 02/13/19 11:36 PM  Result Value Ref Range   WBC 9.4 4.0 - 10.5 K/uL   RBC 4.30 3.87 - 5.11 MIL/uL   Hemoglobin 12.3 12.0 - 15.0 g/dL   HCT 37.1 36.0 - 46.0 %   MCV 86.3 80.0 - 100.0 fL   MCH 28.6 26.0 - 34.0 pg   MCHC 33.2 30.0 - 36.0 g/dL   RDW 15.7 (H) 11.5 - 15.5 %  Platelets 247 150 - 400 K/uL   nRBC 0.0 0.0 - 0.2 %  hCG, quantitative, pregnancy     Status: Abnormal   Collection Time: 02/13/19 11:36 PM  Result Value Ref Range   hCG, Beta Chain, Quant, S 22,560 (H) <5 mIU/mL     IMAGING No results found.  MAU Management/MDM: Ordered usual first trimester r/o ectopic labs.   Pelvic exam and cultures done Will check baseline Ultrasound to rule out ectopic.  This bleeding/pain can represent a normal pregnancy with bleeding, spontaneous abortion or even an ectopic which can be life-threatening.  The process as listed above helps to determine which of these is present.  We did find HCG levels in Care Everywhere.   They were similar to the level we have tonight.  They did not do any imaging there.  HCG levels rose from 19k to 23k. Level tonight is similar at 22k There is nothing seen on [place], so there is high suspicion of SAB, completed We cannot rule out ectopic pregnancy, but there is no evidence on [place].   I will move her appointment from today to Friday and repeat HCG level to see if it drops.    Ibuprofen did relieve her cramping.   ASSESSMENT Pregnancy at 51w4dHeavy bleeding in pregnancy Pelvic pain in pregnancy Pregnancy of unknown location Suspected  SAB  PLAN Discharge home Plan to repeat HCG level in 48 hours in clinic  Will repeat  Ultrasound as indicated Ectopic precautions  Message sent to clinic and Dr CElly Modenato cancel today's new OB appt. And reschedule for HCG Friday  Pt stable at time of discharge. Encouraged to return here or to other Urgent Care/ED if she develops worsening of symptoms, increase in pain, fever, or other concerning symptoms.    MHansel FeinsteinCNM, MSN Certified Nurse-Midwife 02/13/2019  10:33 PM

## 2019-02-13 NOTE — MAU Note (Signed)
.  Tammy [name] is a 24 y.o. at [redacted]w[redacted]d here in MAU reporting: abdominal pain and heavy vaginal bleeding that began around 2100.   Pain score: 10

## 2019-02-14 ENCOUNTER — Telehealth: Payer: Self-pay | Admitting: Obstetrics and Gynecology

## 2019-02-14 ENCOUNTER — Encounter: Payer: Medicaid Other | Admitting: Obstetrics and Gynecology

## 2019-02-14 ENCOUNTER — Encounter: Payer: Self-pay | Admitting: Obstetrics and Gynecology

## 2019-02-14 LAB — GC/CHLAMYDIA PROBE AMP (~~LOC~~) NOT AT ARMC
Chlamydia: NEGATIVE
Comment: NEGATIVE
Comment: NORMAL
Neisseria Gonorrhea: NEGATIVE

## 2019-02-14 LAB — HCG, QUANTITATIVE, PREGNANCY: hCG, Beta Chain, Quant, S: 22560 m[IU]/mL — ABNORMAL HIGH (ref <5)

## 2019-02-14 NOTE — Telephone Encounter (Signed)
Attempted to contact patient to get her rescheduled for her missed ob appointment. No answer and unable to leave a voicemail due to it not being set-up. No show letter mailed.

## 2019-02-14 NOTE — Discharge Instructions (Signed)

## 2019-02-16 ENCOUNTER — Other Ambulatory Visit: Payer: Medicaid Other

## 2019-02-23 ENCOUNTER — Encounter: Payer: Self-pay | Admitting: Obstetrics and Gynecology

## 2019-02-26 ENCOUNTER — Telehealth: Payer: Self-pay

## 2019-02-26 ENCOUNTER — Telehealth: Payer: Self-pay | Admitting: Family Medicine

## 2019-02-26 NOTE — Telephone Encounter (Addendum)
-----   Message from Catalina Antigua, MD sent at 02/22/2019  4:56 PM EST ----- This patient never came in for her quant HCG on 02/16/19. Can we give her a call to see if she can come in please? It does not need to be STAT  Called pt; explained to pt I am calling in regards to missed lab appt on 1/29. Asked if pt would be able to come into office for a lab draw. Pt states she will be able to come on Thursday of this week. Asked pt if she is having any pain or bleeding; pt states she is busy at this time and cannot talk. Will notify front office to reschedule pt.

## 2019-02-26 NOTE — Telephone Encounter (Signed)
Called the patient to inform of the upcoming visit. The patient verbalized understanding °

## 2019-02-27 ENCOUNTER — Other Ambulatory Visit: Payer: Self-pay | Admitting: *Deleted

## 2019-02-27 DIAGNOSIS — O3680X0 Pregnancy with inconclusive fetal viability, not applicable or unspecified: Secondary | ICD-10-CM

## 2019-02-27 DIAGNOSIS — O0001 Abdominal pregnancy with intrauterine pregnancy: Secondary | ICD-10-CM

## 2019-03-01 ENCOUNTER — Encounter: Payer: Self-pay | Admitting: Obstetrics & Gynecology

## 2019-03-01 ENCOUNTER — Other Ambulatory Visit: Payer: Medicaid Other

## 2019-03-06 ENCOUNTER — Telehealth: Payer: Self-pay

## 2019-03-06 NOTE — Telephone Encounter (Signed)
Called pt to advise of BP Cuff arriving, no answer & VM has not been set up.

## 2019-04-04 ENCOUNTER — Ambulatory Visit (HOSPITAL_COMMUNITY): Payer: Medicaid Other

## 2019-04-25 ENCOUNTER — Other Ambulatory Visit: Payer: Self-pay

## 2019-04-25 ENCOUNTER — Emergency Department (HOSPITAL_COMMUNITY)
Admission: EM | Admit: 2019-04-25 | Discharge: 2019-04-25 | Disposition: A | Discharge status: Home / Self Care | Payer: Medicaid Other | Attending: Emergency Medicine | Admitting: Emergency Medicine

## 2019-04-25 ENCOUNTER — Encounter (HOSPITAL_COMMUNITY): Payer: Self-pay | Admitting: Emergency Medicine

## 2019-04-25 ENCOUNTER — Emergency Department (HOSPITAL_COMMUNITY): Payer: Medicaid Other

## 2019-04-25 DIAGNOSIS — R062 Wheezing: Secondary | ICD-10-CM | POA: Diagnosis not present

## 2019-04-25 DIAGNOSIS — R0602 Shortness of breath: Secondary | ICD-10-CM | POA: Diagnosis present

## 2019-04-25 DIAGNOSIS — R05 Cough: Secondary | ICD-10-CM | POA: Diagnosis not present

## 2019-04-25 DIAGNOSIS — F17210 Nicotine dependence, cigarettes, uncomplicated: Secondary | ICD-10-CM | POA: Diagnosis not present

## 2019-04-25 DIAGNOSIS — J4521 Mild intermittent asthma with (acute) exacerbation: Secondary | ICD-10-CM

## 2019-04-25 DIAGNOSIS — R059 Cough, unspecified: Secondary | ICD-10-CM

## 2019-04-25 MED ORDER — PREDNISONE 20 MG PO TABS
60.0000 mg | ORAL_TABLET | Freq: Once | ORAL | Status: AC
  Administered 2019-04-25: 60 mg via ORAL
  Filled 2019-04-25: qty 3

## 2019-04-25 MED ORDER — PREDNISONE 20 MG PO TABS: 40.0000 mg | ORAL_TABLET | Freq: Every day | ORAL | 0 refills | Status: AC

## 2019-04-25 MED ORDER — AEROCHAMBER PLUS FLO-VU LARGE MISC
Status: AC
  Filled 2019-04-25: qty 1

## 2019-04-25 MED ORDER — ALBUTEROL SULFATE HFA 108 (90 BASE) MCG/ACT IN AERS
4.0000 | INHALATION_SPRAY | Freq: Once | RESPIRATORY_TRACT | Status: AC
  Administered 2019-04-25: 11:00:00 4 via RESPIRATORY_TRACT
  Filled 2019-04-25: qty 6.7

## 2019-04-25 MED ORDER — ALBUTEROL SULFATE HFA 108 (90 BASE) MCG/ACT IN AERS: 2.0000 | INHALATION_SPRAY | RESPIRATORY_TRACT | 0 refills | Status: DC | PRN

## 2019-04-25 MED ORDER — AEROCHAMBER PLUS FLO-VU LARGE MISC
1.0000 | Freq: Once | Status: AC
  Administered 2019-04-25: 1

## 2019-04-25 NOTE — ED Provider Notes (Signed)
[facility] EMERGENCY DEPARTMENT Provider Note   CSN: [id number] Arrival date & time: 04/25/19  9528     History Chief Complaint  Patient presents with  . Asthma    Tammy [name] is a 24 y.o. female.  24 y.o female with a PMH Asthma, presents to the ED with a chief complaint of shortness of breath x yesterday. Patient reports being out of her inhaler for the past day. She also endorses a wet cough which she reports just began recently. She does have a history of smoking, cigarettes present on her chair. She does not report any shortness of breath, fever, chest pain.  The history is provided by the patient.  Asthma Pertinent negatives include no chest pain and no shortness of breath.       Past Medical History:  Diagnosis Date  . Arthritis   . Asthma    used inhaler 1 wk ago  . Chlamydia infection   . History of chicken pox   . Preterm delivery     Patient Active Problem List   Diagnosis Date Noted  . Supervision of high risk pregnancy, antepartum 01/31/2019  . History of prior pregnancy with IUGR newborn 01/31/2019  . Asthma during pregnancy   . History of preterm delivery, currently pregnant   . NSVD (normal spontaneous vaginal delivery) 11/23/2012  . Asthma, severe persistent, poorly-controlled 06/22/2012  . Asthma, chronic 06/21/2012  . History of unprotected sex 06/21/2012  . Abdominal pregnancy with intrauterine pregnancy 06/21/2012  . Depo-Provera contraceptive status 02/11/2012  . Asthma 11/05/2011  . Postpartum care following vaginal delivery 01/13/2011  . Chlamydia trachomatis infection of other genitourinary sites 01/13/2011    Past Surgical History:  Procedure Laterality Date  . CESAREAN SECTION    . NO PAST SURGERIES       OB History    Gravida  4   Para  3   Term  2   Preterm  1   AB      Living  3     SAB      TAB      Ectopic      Multiple      Live Births  3           Family History  Problem  Relation Age of Onset  . Migraines Mother   . Mitral valve prolapse Mother   . Anemia Maternal Aunt   . Mitral valve prolapse Maternal Aunt   . Heart disease Paternal Uncle   . Mitral valve prolapse Maternal Grandmother   . Dementia Maternal Grandmother   . Kidney disease Paternal Grandmother        dialysis  . Cancer Paternal Grandfather        Lung    Social History   Tobacco Use  . Smoking status: Current Some Day Smoker    Packs/day: 0.25    Types: Cigarettes  . Smokeless tobacco: Never Used  Substance Use Topics  . Alcohol use: Not Currently    Comment: occ  . Drug use: No    Home Medications Prior to Admission medications   Medication Sig Start Date End Date Taking? Authorizing Provider  albuterol (VENTOLIN HFA) 108 (90 Base) MCG/ACT inhaler Inhale 2 puffs into the lungs every 4 (four) hours as needed for wheezing or shortness of breath. 04/25/19   Janeece Fitting, PA-C  Blood Pressure Monitoring (BLOOD PRESSURE KIT) DEVI 1 Device by Does not apply route as needed. 01/31/19   Constant, Peggy,  MD  clotrimazole-betamethasone (LOTRISONE) cream Apply 1 application topically 2 (two) times daily. For 5 days. Patient not taking: Reported on 01/12/2019 04/24/17   Everett Graff, MD  predniSONE (DELTASONE) 20 MG tablet Take 2 tablets (40 mg total) by mouth daily for 5 days. 04/25/19 04/30/19  Janeece Fitting, PA-C    Allergies    Pollen extract  Review of Systems   Review of Systems  Constitutional: Negative for fever.  Respiratory: Positive for wheezing. Negative for shortness of breath.   Cardiovascular: Negative for chest pain.    Physical Exam Updated Vital Signs BP 111/74 (BP Location: Left Arm)   Pulse 97   Temp 98.4 F (36.9 C) (Oral)   Resp 18   Ht _0  (1.6 m)   Wt 54.4 kg   LMP 11/18/2018 (Exact Date)   SpO2 97%   Breastfeeding Unknown   BMI 21.26 kg/m   Physical Exam Vitals and nursing note reviewed.  Constitutional:      Appearance: Normal appearance.    HENT:     Head: Normocephalic and atraumatic.     Nose: Nose normal.     Mouth/Throat:     Mouth: Mucous membranes are moist.  Eyes:     Pupils: Pupils are equal, round, and reactive to light.  Cardiovascular:     Rate and Rhythm: Normal rate.  Pulmonary:     Effort: Pulmonary effort is normal.     Breath sounds: Examination of the right-upper field reveals wheezing. Examination of the left-upper field reveals wheezing. Examination of the right-middle field reveals wheezing. Examination of the right-lower field reveals wheezing. Examination of the left-lower field reveals wheezing. Wheezing present. No rhonchi or rales.     Comments: Inspiratory wheezing throughout all lungs fields. No tachypnea.  Abdominal:     General: Abdomen is flat.     Tenderness: There is no abdominal tenderness. There is no right CVA tenderness, left CVA tenderness or guarding.  Musculoskeletal:     Cervical back: Normal range of motion and neck supple.  Skin:    General: Skin is warm and dry.  Neurological:     Mental Status: She is alert and oriented to person, place, and time.     ED Results / Procedures / Treatments   Labs (all labs ordered are listed, but only abnormal results are displayed) Labs Reviewed  POC URINE PREG, ED    EKG None  Radiology DG Chest 2 View  Result Date: 04/25/2019 CLINICAL DATA:  Shortness of breath and cough EXAM: CHEST - 2 VIEW COMPARISON:  11/01/2018 FINDINGS: The heart size and mediastinal contours are within normal limits. Both lungs are clear. The visualized skeletal structures are unremarkable. IMPRESSION: No active cardiopulmonary disease. Electronically Signed   By: Inez Catalina M.D.   On: 04/25/2019 10:31    Procedures Procedures (including critical care time)  Medications Ordered in ED Medications  AeroChamber Plus Flo-Vu Large MISC (has no administration in time range)  predniSONE (DELTASONE) tablet 60 mg (60 mg Oral Given 04/25/19 1057)  albuterol  (VENTOLIN HFA) 108 (90 Base) MCG/ACT inhaler 4 puff (4 puffs Inhalation Given 04/25/19 1056)  AeroChamber Plus Flo-Vu Large MISC 1 each (1 each Other Given 04/25/19 1057)    ED Course  I have reviewed the triage vital signs and the nursing notes.  Pertinent labs & imaging results that were available during my care of the patient were reviewed by me and considered in my medical decision making (see chart for details).  MDM Rules/Calculators/A&P   Patient with a past medical history of asthma presents to the ED after running her inhaler yesterday.  Reports he has been out of her inhaler for a day.  Does have a positive smoking history.  States she is also had a cough, this is productive but has not been running any fevers.  During evaluation patient appears uncomfortable, vitals are within normal limits without any fever.  Rotation of all lung fields with wheezing present throughout. No expiratory wheezing with tachypnea, stridor.  No increased work of breathing.  We discussed chest x-ray rule out any factious process with her wet productive cough.  Also provided with Deltasone while in the ED to help with her wheezing.  Review of her medication list does show an albuterol inhaler which she last got refill on January 2021, this will also be refilled on today's visit.   Patient provided with albuterol inhaler along with Deltasone.  She was started by nursing staff on spacer use.  Chest x-ray which I personally reviewed without any signs of Monia, pleural effusion, pneumothorax.  We discussed steroid therapy to help with wheezing.  11:12 AM patient reevaluated by me, wheezing has improved significantly after liter inhaler.  She is also requesting a fill on her albuterol medication.  This will be provided for patient.  Vitals are within normal limits, patient discharged from the ED in stable condition.   Portions of this note were generated with Lobbyist. Dictation errors may occur  despite best attempts at proofreading.  Final Clinical Impression(s) / ED Diagnoses Final diagnoses:  Mild intermittent asthma with exacerbation  Cough    Rx / DC Orders ED Discharge Orders         Ordered    albuterol (VENTOLIN HFA) 108 (90 Base) MCG/ACT inhaler  Every 4 hours PRN     04/25/19 1113    predniSONE (DELTASONE) 20 MG tablet  Daily     04/25/19 1113           Janeece Fitting, PA-C 04/25/19 1114    Tegeler, Gwenyth Allegra, MD 04/25/19 [phone]

## 2019-04-25 NOTE — Discharge Instructions (Addendum)
I have provided a refill for your of your inhaler.  He also received an extra inhaler while in the ED.  Please use this as needed to help with your wheezing.  I have also written a prescription for steroid burst, please take 2 tablets daily for the next 5 days.  Be aware this medication can cause flushness, insomnia, appetite changes.

## 2019-04-25 NOTE — ED Triage Notes (Signed)
Pt reports being out of her inhaler and is having an asthma attack. Endorses HA

## 2019-07-10 ENCOUNTER — Encounter (HOSPITAL_COMMUNITY): Payer: Self-pay

## 2019-07-10 ENCOUNTER — Emergency Department (HOSPITAL_COMMUNITY): Payer: Medicaid Other

## 2019-07-10 ENCOUNTER — Emergency Department (HOSPITAL_COMMUNITY)
Admission: EM | Admit: 2019-07-10 | Discharge: 2019-07-10 | Disposition: A | Discharge status: Home / Self Care | Payer: Medicaid Other | Attending: Emergency Medicine | Admitting: Emergency Medicine

## 2019-07-10 ENCOUNTER — Other Ambulatory Visit: Payer: Self-pay

## 2019-07-10 DIAGNOSIS — Z79899 Other long term (current) drug therapy: Secondary | ICD-10-CM | POA: Insufficient documentation

## 2019-07-10 DIAGNOSIS — J4541 Moderate persistent asthma with (acute) exacerbation: Secondary | ICD-10-CM

## 2019-07-10 DIAGNOSIS — R2231 Localized swelling, mass and lump, right upper limb: Secondary | ICD-10-CM

## 2019-07-10 DIAGNOSIS — R0602 Shortness of breath: Secondary | ICD-10-CM | POA: Diagnosis present

## 2019-07-10 DIAGNOSIS — F17210 Nicotine dependence, cigarettes, uncomplicated: Secondary | ICD-10-CM | POA: Insufficient documentation

## 2019-07-10 LAB — I-STAT BETA HCG BLOOD, ED (MC, WL, AP ONLY): I-stat hCG, quantitative: <5 m[IU]/mL (ref <5)

## 2019-07-10 MED ORDER — ALBUTEROL SULFATE HFA 108 (90 BASE) MCG/ACT IN AERS
6.0000 | INHALATION_SPRAY | Freq: Once | RESPIRATORY_TRACT | Status: AC
  Administered 2019-07-10: 6 via RESPIRATORY_TRACT
  Filled 2019-07-10: qty 6.7

## 2019-07-10 MED ORDER — PREDNISONE 20 MG PO TABS: 40.0000 mg | ORAL_TABLET | Freq: Every day | ORAL | 0 refills | Status: AC

## 2019-07-10 MED ORDER — ALBUTEROL SULFATE HFA 108 (90 BASE) MCG/ACT IN AERS: 2.0000 | INHALATION_SPRAY | RESPIRATORY_TRACT | 3 refills | Status: DC | PRN

## 2019-07-10 MED ORDER — MAGNESIUM SULFATE 2 GM/50ML IV SOLN
2.0000 g | Freq: Once | INTRAVENOUS | Status: AC
  Administered 2019-07-10: 2 g via INTRAVENOUS
  Filled 2019-07-10: qty 50

## 2019-07-10 MED ORDER — AEROCHAMBER PLUS FLO-VU MEDIUM MISC
1.0000 | Freq: Once | Status: AC
  Administered 2019-07-10: 1
  Filled 2019-07-10: qty 1

## 2019-07-10 MED ORDER — ALBUTEROL SULFATE HFA 108 (90 BASE) MCG/ACT IN AERS
6.0000 | INHALATION_SPRAY | Freq: Once | RESPIRATORY_TRACT | Status: AC
  Administered 2019-07-10: 6 via RESPIRATORY_TRACT

## 2019-07-10 NOTE — ED Triage Notes (Signed)
EMS reports called out for Asthma, SOB, states she doses not have medications.   10mg  albuterol 0.5 Atrovent 125mg  Solumedrol enroute.  18 LAC  BP 119/76 HR 80 RR 18 Sp02 100 10ltrs

## 2019-07-10 NOTE — ED Provider Notes (Signed)
[name] Provider Note   CSN: [id number] Arrival date & time: 07/10/19  7673     History Chief Complaint  Patient presents with  . Shortness of Breath    Tammy [name] is a 24 y.o. female with a past medical history of asthma who presents today for evaluation of an asthma exacerbation.  Patient was brought in by EMS who report that on arrival she was in significant distress with both inspiratory and expiratory wheezes.  She had a total of 10 mg of albuterol neb, 0.5 mg Atrovent, 125 mg IV Solu-Medrol.  EMS reports she had both inspiratory and expiratory wheezing. She does not have an inhaler currently at home.  She reports that she has pain in her left arm where her IV is however otherwise denies any pain.  She reports this feels like her usual asthma attack.  She has not been vaccinated against Covid.  Denies any fevers.    No leg swelling or hemoptysis.  She reports that she smokes, it takes her about a week to go through a pack of cigarettes.   HPI     Past Medical History:  Diagnosis Date  . Arthritis   . Asthma    used inhaler 1 wk ago  . Chlamydia infection   . History of chicken pox   . Preterm delivery     Patient Active Problem List   Diagnosis Date Noted  . Supervision of high risk pregnancy, antepartum 01/31/2019  . History of prior pregnancy with IUGR newborn 01/31/2019  . Asthma during pregnancy   . History of preterm delivery, currently pregnant   . NSVD (normal spontaneous vaginal delivery) 11/23/2012  . Asthma, severe persistent, poorly-controlled 06/22/2012  . Asthma, chronic 06/21/2012  . History of unprotected sex 06/21/2012  . Abdominal pregnancy with intrauterine pregnancy 06/21/2012  . Depo-Provera contraceptive status 02/11/2012  . Asthma 11/05/2011  . Postpartum care following vaginal delivery 01/13/2011  . Chlamydia trachomatis infection of other genitourinary sites 01/13/2011    Past Surgical History:    Procedure Laterality Date  . CESAREAN SECTION    . NO PAST SURGERIES       OB History    Gravida  4   Para  3   Term  2   Preterm  1   AB      Living  3     SAB      TAB      Ectopic      Multiple      Live Births  3           Family History  Problem Relation Age of Onset  . Migraines Mother   . Mitral valve prolapse Mother   . Anemia Maternal Aunt   . Mitral valve prolapse Maternal Aunt   . Heart disease Paternal Uncle   . Mitral valve prolapse Maternal Grandmother   . Dementia Maternal Grandmother   . Kidney disease Paternal Grandmother        dialysis  . Cancer Paternal Grandfather        Lung    Social History   Tobacco Use  . Smoking status: Current Some Day Smoker    Packs/day: 0.25    Types: Cigarettes  . Smokeless tobacco: Never Used  Vaping Use  . Vaping Use: Never used  Substance Use Topics  . Alcohol use: Not Currently    Comment: occ  . Drug use: No    Home Medications Prior to  Admission medications   Medication Sig Start Date End Date Taking? Authorizing Provider  albuterol (VENTOLIN HFA) 108 (90 Base) MCG/ACT inhaler Inhale 2 puffs into the lungs every 4 (four) hours as needed for wheezing or shortness of breath. 07/10/19   Lorin Glass, PA-C  Blood Pressure Monitoring (BLOOD PRESSURE KIT) DEVI 1 Device by Does not apply route as needed. 01/31/19   Constant, Peggy, MD  clotrimazole-betamethasone (LOTRISONE) cream Apply 1 application topically 2 (two) times daily. For 5 days. Patient not taking: Reported on 01/12/2019 04/24/17   Everett Graff, MD  predniSONE (DELTASONE) 20 MG tablet Take 2 tablets (40 mg total) by mouth daily for 4 days. 07/10/19 07/14/19  Lorin Glass, PA-C    Allergies    Pollen extract  Review of Systems   Review of Systems  Constitutional: Negative for chills and fever.  Respiratory: Positive for cough, chest tightness and shortness of breath.   Gastrointestinal: Negative for abdominal  pain, diarrhea, nausea and vomiting.  Musculoskeletal: Negative for back pain.  Neurological: Negative for headaches.  All other systems reviewed and are negative.   Physical Exam Updated Vital Signs BP 135/81   Pulse (!) 105   Temp 98.3 F (36.8 C) (Oral)   Resp 16   LMP 11/18/2018 (Exact Date)   SpO2 95%   Physical Exam Vitals and nursing note reviewed.  Constitutional:      General: She is not in acute distress.    Appearance: She is well-developed. She is not diaphoretic.     Comments: Sleepy however wakes up to voice.    HENT:     Head: Normocephalic and atraumatic.     Mouth/Throat:     Mouth: Mucous membranes are moist.  Eyes:     General: No scleral icterus.       Right eye: No discharge.        Left eye: No discharge.     Conjunctiva/sclera: Conjunctivae normal.  Cardiovascular:     Rate and Rhythm: Normal rate and regular rhythm.  Pulmonary:     Effort: Pulmonary effort is normal. No accessory muscle usage or respiratory distress.     Breath sounds: No stridor. Wheezing (faint wheezes bilaterally, heard louder on left than right. ) present.  Chest:     Chest wall: No tenderness.  Abdominal:     General: There is no distension.  Musculoskeletal:        General: No deformity.     Cervical back: Normal range of motion.     Right lower leg: No tenderness. No edema.     Left lower leg: No tenderness. No edema.  Skin:    General: Skin is warm and dry.  Neurological:     General: No focal deficit present.     Mental Status: She is alert.     Motor: No abnormal muscle tone.  Psychiatric:        Mood and Affect: Mood normal.        Behavior: Behavior normal.     ED Results / Procedures / Treatments   Labs (all labs ordered are listed, but only abnormal results are displayed) Labs Reviewed  I-STAT BETA HCG BLOOD, ED ([facility], [facility], [facility] ONLY)    EKG None  Radiology DG Chest Port 1 View  Result Date: 07/10/2019 CLINICAL DATA:  Asthma. EXAM: PORTABLE CHEST 1  VIEW COMPARISON:  Chest x-ray 04/25/2019. FINDINGS: Mediastinum and hilar structures normal. Heart size normal. No focal infiltrate. No pleural effusion or pneumothorax. Degenerative  changes scoliosis thoracic spine. IMPRESSION: No acute cardiopulmonary disease. Electronically Signed   By: Marcello Moores  Register   On: 07/10/2019 09:11    Procedures Procedures (including critical care time)  Medications Ordered in ED Medications  albuterol (VENTOLIN HFA) 108 (90 Base) MCG/ACT inhaler 6 puff (6 puffs Inhalation Given 07/10/19 0849)  AeroChamber Plus Flo-Vu Medium MISC 1 each (1 each Other Given 07/10/19 0849)  magnesium sulfate IVPB 2 g 50 mL (0 g Intravenous Stopped 07/10/19 0958)  albuterol (VENTOLIN HFA) 108 (90 Base) MCG/ACT inhaler 6 puff (6 puffs Inhalation Given 07/10/19 1027)    ED Course  I have reviewed the triage vital signs and the nursing notes.  Pertinent labs & imaging results that were available during my care of the patient were reviewed by me and considered in my medical decision making (see chart for details).  Clinical Course as of Jul 09 1317  Tue Jul 10, 2019  1032 Patient reevaluated, she is lying in bed.  She keeps her eyes closed and lonely answer questions with a mumble yes or no.  When I asked her if there was anything more going on and explained that if her breathing is bad enough that she is acting like this and we need to consider more aggressive measures, she said that she feels fine and is just a little wheezy.  Appears to be behavioral rather than physiologic.   [EH]  1154 Patient re-evalauted, it was noticed that she has swelling in the right hand.  She does not have pain, denies any injury.  Her IV is in the left arm.  Will elevate and re-evaluate.  She is awake and alert, interactive and says she wants to go home.    [EH]  1224 Patient re-evaluated, her hand is still swollen, unchanged and non painful. No erythema.  She refuses x-ray at this time.    [EH]      Clinical Course User Index [EH] Ollen Gross   MDM Rules/Calculators/A&P                          Patient is a 24 year old woman who presents today for evaluation after an asthma attack.  She has a longstanding history of asthma and reportedly had not been taking medications.  She was treated by EMS in route with 10 mg of albuterol, 0.5 mg of Atrovent, and 125 mg of Solu-Medrol.  On my evaluation she still had wheezing.  She was given additional albuterol, x2 while in the ER.  Pregnancy test is negative.  Chest x-ray is clear.  She refuses additional testing.  She was able to ambulate well in the emergency room with oxygen remaining 95%.  She denies any concern for any cause of her symptoms other than asthma.   While in the ER she was noted to have swelling in her right hand.  She has not been given any injections in her right hand or arm.  This is nonpainful.  She denies trauma.  No erythema or evidence of infection.  She refused x-ray and additional evaluation.  Recommended close observation and elevation.  She does not have pain or edema in the remaineder of the right arm.  CNS intact.   She is given rx for additional albuterol, and the inhaler that she used here and spacer to take home in addition to a prescription for prednisone burst.  Recommended PCP follow-up.  Return precautions were discussed with patient who states their understanding.  At the time of discharge patient denied any unaddressed complaints or concerns.  Patient is agreeable for discharge home.  Note: Portions of this report may have been transcribed using voice recognition software. Every effort was made to ensure accuracy; however, inadvertent computerized transcription errors may be present   Final Clinical Impression(s) / ED Diagnoses Final diagnoses:  Moderate persistent asthma with exacerbation  Localized swelling on right hand    Rx / DC Orders ED Discharge Orders         Ordered    albuterol  (VENTOLIN HFA) 108 (90 Base) MCG/ACT inhaler  Every 4 hours PRN     Discontinue  Reprint     07/10/19 1131    predniSONE (DELTASONE) 20 MG tablet  Daily     Discontinue  Reprint     07/10/19 [place], Conya [address], [place] 07/10/19 1319    Pattricia Boss, MD 07/10/19 [phone]

## 2019-07-10 NOTE — ED Notes (Signed)
Pt has obvious right hand swelling. Rn notified.

## 2019-07-10 NOTE — ED Notes (Signed)
Pt ambulated room air O2 remained 95%.

## 2019-07-10 NOTE — Discharge Instructions (Addendum)
I have given you a prescription for steroids today.  Some common side effects include feelings of extra energy, feeling warm, increased appetite, and stomach upset.  If you are diabetic your sugars may run higher than usual.  Please take your steroids starting tomorrow.

## 2019-07-30 ENCOUNTER — Encounter (HOSPITAL_COMMUNITY): Payer: Self-pay | Admitting: Emergency Medicine

## 2019-07-30 ENCOUNTER — Ambulatory Visit (HOSPITAL_COMMUNITY)
Admission: EM | Admit: 2019-07-30 | Discharge: 2019-07-30 | Disposition: A | Discharge status: Home / Self Care | Payer: Medicaid Other | Attending: Physician Assistant | Admitting: Physician Assistant

## 2019-07-30 ENCOUNTER — Other Ambulatory Visit: Payer: Self-pay

## 2019-07-30 DIAGNOSIS — J4531 Mild persistent asthma with (acute) exacerbation: Secondary | ICD-10-CM

## 2019-07-30 MED ORDER — ALBUTEROL SULFATE HFA 108 (90 BASE) MCG/ACT IN AERS: 2.0000 | INHALATION_SPRAY | RESPIRATORY_TRACT | 3 refills | Status: DC | PRN

## 2019-07-30 NOTE — ED Notes (Signed)
Patient declined covid testing 

## 2019-07-30 NOTE — ED Triage Notes (Addendum)
Patient requesting an asthma pump.  States misplaced her pump, no pcp Patient is coughing.  States asthma started bothering her this morning Wheezing earlier today

## 2019-07-30 NOTE — ED Provider Notes (Signed)
[facility]    CSN: [id number] Arrival date & time: 07/30/19  0820      History   Chief Complaint Chief Complaint  Patient presents with  . Asthma    HPI Tammy [name] is a 24 y.o. female.   Pt reports she is out of albuterol. No local md  The history is provided by the patient. No language interpreter was used.  Asthma This is a new problem. The current episode started 2 days ago. The problem occurs constantly. Nothing aggravates the symptoms. Nothing relieves the symptoms. She has tried nothing for the symptoms. The treatment provided no relief.    Past Medical History:  Diagnosis Date  . Arthritis   . Asthma    used inhaler 1 wk ago  . Chlamydia infection   . History of chicken pox   . Preterm delivery     Patient Active Problem List   Diagnosis Date Noted  . Supervision of high risk pregnancy, antepartum 01/31/2019  . History of prior pregnancy with IUGR newborn 01/31/2019  . Asthma during pregnancy   . History of preterm delivery, currently pregnant   . NSVD (normal spontaneous vaginal delivery) 11/23/2012  . Asthma, severe persistent, poorly-controlled 06/22/2012  . Asthma, chronic 06/21/2012  . History of unprotected sex 06/21/2012  . Abdominal pregnancy with intrauterine pregnancy 06/21/2012  . Depo-Provera contraceptive status 02/11/2012  . Asthma 11/05/2011  . Postpartum care following vaginal delivery 01/13/2011  . Chlamydia trachomatis infection of other genitourinary sites 01/13/2011    Past Surgical History:  Procedure Laterality Date  . CESAREAN SECTION    . NO PAST SURGERIES      OB History    Gravida  4   Para  3   Term  2   Preterm  1   AB      Living  3     SAB      TAB      Ectopic      Multiple      Live Births  3            Home Medications    Prior to Admission medications   Medication Sig Start Date End Date Taking? Authorizing Provider  albuterol (VENTOLIN HFA) 108 (90 Base) MCG/ACT  inhaler Inhale 2 puffs into the lungs every 4 (four) hours as needed for wheezing or shortness of breath. 07/10/19   Lorin Glass, PA-C  Blood Pressure Monitoring (BLOOD PRESSURE KIT) DEVI 1 Device by Does not apply route as needed. 01/31/19   Constant, Peggy, MD  clotrimazole-betamethasone (LOTRISONE) cream Apply 1 application topically 2 (two) times daily. For 5 days. Patient not taking: Reported on 01/12/2019 04/24/17   Everett Graff, MD    Family History Family History  Problem Relation Age of Onset  . Migraines Mother   . Mitral valve prolapse Mother   . Anemia Maternal Aunt   . Mitral valve prolapse Maternal Aunt   . Heart disease Paternal Uncle   . Mitral valve prolapse Maternal Grandmother   . Dementia Maternal Grandmother   . Kidney disease Paternal Grandmother        dialysis  . Cancer Paternal Grandfather        Lung    Social History Social History   Tobacco Use  . Smoking status: Current Some Day Smoker    Packs/day: 0.25    Types: Cigarettes  . Smokeless tobacco: Never Used  Vaping Use  . Vaping Use: Never used  Substance  Use Topics  . Alcohol use: Yes    Comment: occ  . Drug use: No     Allergies   Pollen extract   Review of Systems Review of Systems  All other systems reviewed and are negative.    Physical Exam Triage Vital Signs ED Triage Vitals  Enc Vitals Group     BP 07/30/19 0845 122/75     Pulse Rate 07/30/19 0845 80     Resp 07/30/19 0845 (!) 22     Temp 07/30/19 0845 98.3 F (36.8 C)     Temp Source 07/30/19 0845 Oral     SpO2 07/30/19 0845 98 %     Weight --      Height --      Head Circumference --      Peak Flow --      Pain Score 07/30/19 0842 0     Pain Loc --      Pain Edu? --      Excl. in [place]? --    No data found.  Updated Vital Signs BP 122/75 (BP Location: Left Arm)   Pulse 80   Temp 98.3 F (36.8 C) (Oral)   Resp (!) 22   LMP 07/19/2019   SpO2 98%   Visual Acuity Right Eye Distance:   Left Eye  Distance:   Bilateral Distance:    Right Eye Near:   Left Eye Near:    Bilateral Near:     Physical Exam Vitals and nursing note reviewed.  Constitutional:      Appearance: She is well-developed.  HENT:     Head: Normocephalic.  Cardiovascular:     Rate and Rhythm: Normal rate.  Pulmonary:     Effort: Pulmonary effort is normal.  Abdominal:     General: There is no distension.  Musculoskeletal:        General: Normal range of motion.     Cervical back: Normal range of motion.  Skin:    General: Skin is warm.  Neurological:     General: No focal deficit present.     Mental Status: She is alert and oriented to person, place, and time.  Psychiatric:        Mood and Affect: Mood normal.      UC Treatments / Results  Labs (all labs ordered are listed, but only abnormal results are displayed) Labs Reviewed - No data to display  EKG   Radiology No results found.  Procedures Procedures (including critical care time)  Medications Ordered in UC Medications - No data to display  Initial Impression / Assessment and Plan / UC Course  I have reviewed the triage vital signs and the nursing notes.  Pertinent labs & imaging results that were available during my care of the patient were reviewed by me and considered in my medical decision making (see chart for details).     MDM:  Rx for albuterol with 3 refills  Final Clinical Impressions(s) / UC Diagnoses   Final diagnoses:  Mild persistent asthma with exacerbation   Discharge Instructions   None    ED Prescriptions    Medication Sig Dispense Auth. Provider   albuterol (VENTOLIN HFA) 108 (90 Base) MCG/ACT inhaler Inhale 2 puffs into the lungs every 4 (four) hours as needed for wheezing or shortness of breath. 6.7 g Fransico Mead[place], [place]     PDMP not reviewed this encounter.  An After Visit Summary was printed and given to the patient.  Fransico Meadow, [place] 07/30/19 [phone]

## 2019-08-14 ENCOUNTER — Encounter: Payer: Self-pay | Admitting: Advanced Practice Midwife

## 2019-08-14 DIAGNOSIS — O039 Complete or unspecified spontaneous abortion without complication: Secondary | ICD-10-CM | POA: Insufficient documentation

## 2019-10-04 ENCOUNTER — Encounter (HOSPITAL_COMMUNITY): Payer: Self-pay

## 2019-10-04 ENCOUNTER — Other Ambulatory Visit: Payer: Self-pay

## 2019-10-04 ENCOUNTER — Emergency Department (HOSPITAL_COMMUNITY)
Admission: EM | Admit: 2019-10-04 | Discharge: 2019-10-04 | Disposition: A | Discharge status: Home / Self Care | Payer: Medicaid Other | Attending: Emergency Medicine | Admitting: Emergency Medicine

## 2019-10-04 DIAGNOSIS — R0602 Shortness of breath: Secondary | ICD-10-CM | POA: Diagnosis present

## 2019-10-04 DIAGNOSIS — J4531 Mild persistent asthma with (acute) exacerbation: Secondary | ICD-10-CM

## 2019-10-04 DIAGNOSIS — Z87891 Personal history of nicotine dependence: Secondary | ICD-10-CM | POA: Diagnosis not present

## 2019-10-04 MED ORDER — PREDNISONE 20 MG PO TABS
60.0000 mg | ORAL_TABLET | Freq: Once | ORAL | Status: AC
  Administered 2019-10-04: 60 mg via ORAL
  Filled 2019-10-04: qty 3

## 2019-10-04 MED ORDER — PREDNISONE 20 MG PO TABS: 60.0000 mg | ORAL_TABLET | Freq: Once | ORAL | Status: DC

## 2019-10-04 MED ORDER — ALBUTEROL SULFATE HFA 108 (90 BASE) MCG/ACT IN AERS: 2.0000 | INHALATION_SPRAY | RESPIRATORY_TRACT | 2 refills | Status: DC | PRN

## 2019-10-04 MED ORDER — ALBUTEROL SULFATE HFA 108 (90 BASE) MCG/ACT IN AERS: 2.0000 | INHALATION_SPRAY | Freq: Once | RESPIRATORY_TRACT | Status: DC

## 2019-10-04 MED ORDER — PREDNISONE 10 MG PO TABS: 40.0000 mg | ORAL_TABLET | Freq: Every day | ORAL | 0 refills | Status: AC

## 2019-10-04 MED ORDER — IPRATROPIUM BROMIDE HFA 17 MCG/ACT IN AERS
4.0000 | INHALATION_SPRAY | Freq: Once | RESPIRATORY_TRACT | Status: AC
  Administered 2019-10-04: 4 via RESPIRATORY_TRACT
  Filled 2019-10-04: qty 12.9

## 2019-10-04 MED ORDER — ALBUTEROL SULFATE HFA 108 (90 BASE) MCG/ACT IN AERS
4.0000 | INHALATION_SPRAY | Freq: Once | RESPIRATORY_TRACT | Status: AC
  Administered 2019-10-04: 4 via RESPIRATORY_TRACT
  Filled 2019-10-04: qty 6.7

## 2019-10-04 NOTE — ED Triage Notes (Addendum)
Patient states she began having asthma and SOB last night, but ran out of her Albuterol inhaler.  Patient states she is pregnant, but has not gone for her 1st OB/GYN appointment.

## 2019-10-04 NOTE — Discharge Instructions (Signed)
Have sent you a new prescription for an albuterol inhaler with multiple refills.  I have also given you follow-up information below for [facility].  They can provide primary care services for you.  Please give them a call as soon as possible to schedule a follow-up visit.  Please return to the ER if you develop any new or worsening symptoms.  It was a pleasure to meet you.

## 2019-10-04 NOTE — ED Provider Notes (Signed)
[facility] DEPT Provider Note   CSN: [id number] Arrival date & time: 10/04/19  6301     History Chief Complaint  Patient presents with  . Asthma    Tammy [name] is a 24 y.o. female.  HPI Patient is a 24 year old female with history of asthma who presents to the emergency department due to shortness of breath and wheezing that started last night.  Patient states that she ran out of her albuterol inhaler. Sx are consistent with prior asthma exacerbations. She states she is currently pregnant but is unsure for how long she has been pregnant.  She has not been evaluated by an OB/GYN but states she has follow-up with them in 3 days.  She denies abdominal pain, chest pain, nausea, vomiting, vaginal discharge, vaginal bleeding, dysuria.    Past Medical History:  Diagnosis Date  . Arthritis   . Asthma    used inhaler 1 wk ago  . Chlamydia infection   . History of chicken pox   . Preterm delivery     Patient Active Problem List   Diagnosis Date Noted  . SAB (spontaneous abortion) 08/14/2019  . History of prior pregnancy with IUGR newborn 01/31/2019  . History of preterm delivery, currently pregnant   . Asthma, severe persistent, poorly-controlled 06/22/2012  . Asthma, chronic 06/21/2012  . History of unprotected sex 06/21/2012  . Depo-Provera contraceptive status 02/11/2012  . Asthma 11/05/2011  . Postpartum care following vaginal delivery 01/13/2011  . Chlamydia trachomatis infection of other genitourinary sites 01/13/2011    Past Surgical History:  Procedure Laterality Date  . CESAREAN SECTION    . NO PAST SURGERIES       OB History    Gravida  5   Para  3   Term  2   Preterm  1   AB      Living  3     SAB      TAB      Ectopic      Multiple      Live Births  3           Family History  Problem Relation Age of Onset  . Migraines Mother   . Mitral valve prolapse Mother   . Anemia Maternal Aunt   . Mitral  valve prolapse Maternal Aunt   . Heart disease Paternal Uncle   . Mitral valve prolapse Maternal Grandmother   . Dementia Maternal Grandmother   . Kidney disease Paternal Grandmother        dialysis  . Cancer Paternal Grandfather        Lung    Social History   Tobacco Use  . Smoking status: Former Smoker    Packs/day: 0.25    Types: Cigarettes  . Smokeless tobacco: Never Used  Vaping Use  . Vaping Use: Never used  Substance Use Topics  . Alcohol use: Not Currently    Comment: occ  . Drug use: No    Home Medications Prior to Admission medications   Medication Sig Start Date End Date Taking? Authorizing Provider  albuterol (VENTOLIN HFA) 108 (90 Base) MCG/ACT inhaler Inhale 2 puffs into the lungs every 4 (four) hours as needed for wheezing or shortness of breath. 07/30/19   Fransico Meadow, PA-C  Blood Pressure Monitoring (BLOOD PRESSURE KIT) DEVI 1 Device by Does not apply route as needed. 01/31/19   Constant, Peggy, MD  clotrimazole-betamethasone (LOTRISONE) cream Apply 1 application topically 2 (two) times daily. For 5  days. Patient not taking: Reported on 01/12/2019 04/24/17   Everett Graff, MD    Allergies    Pollen extract  Review of Systems   Review of Systems  Constitutional: Negative for chills and fever.  HENT: Negative for congestion.   Respiratory: Positive for shortness of breath and wheezing.   Cardiovascular: Negative for chest pain.  Gastrointestinal: Negative for abdominal pain, nausea and vomiting.  Genitourinary: Negative for dysuria, hematuria, vaginal bleeding, vaginal discharge and vaginal pain.   Physical Exam Updated Vital Signs BP (!) 118/59 (BP Location: Left Arm)   Pulse 75   Temp 98.5 F (36.9 C) (Oral)   Resp 16   Ht _0  (1.6 m)   Wt 52.2 kg   LMP 07/19/2019   SpO2 93%   BMI 20.37 kg/m   Physical Exam Vitals and nursing note reviewed.  Constitutional:      General: She is not in acute distress.    Appearance: Normal  appearance. She is not ill-appearing, toxic-appearing or diaphoretic.  HENT:     Head: Normocephalic and atraumatic.     Right Ear: External ear normal.     Left Ear: External ear normal.     Nose: Nose normal.     Mouth/Throat:     Mouth: Mucous membranes are moist.     Pharynx: Oropharynx is clear. No oropharyngeal exudate or posterior oropharyngeal erythema.  Eyes:     General: No scleral icterus.       Right eye: No discharge.        Left eye: No discharge.     Extraocular Movements: Extraocular movements intact.     Conjunctiva/sclera: Conjunctivae normal.  Cardiovascular:     Rate and Rhythm: Normal rate and regular rhythm.     Pulses: Normal pulses.     Heart sounds: Normal heart sounds. No murmur heard.  No friction rub. No gallop.   Pulmonary:     Effort: Respiratory distress present.     Breath sounds: No stridor. Wheezing present. No rhonchi or rales.     Comments: Diffuse inspiratory and expiratory wheezes noted in the posterior lung fields.  Patient speaking in clear and complete sentences. Abdominal:     General: Abdomen is flat.     Palpations: Abdomen is soft.     Tenderness: There is no abdominal tenderness.  Musculoskeletal:        General: Normal range of motion.     Cervical back: Normal range of motion and neck supple. No tenderness.  Skin:    General: Skin is warm and dry.  Neurological:     General: No focal deficit present.     Mental Status: She is alert and oriented to person, place, and time.  Psychiatric:        Mood and Affect: Mood normal.        Behavior: Behavior normal.    ED Results / Procedures / Treatments   Labs (all labs ordered are listed, but only abnormal results are displayed) Labs Reviewed - No data to display  EKG None  Radiology No results found.  Procedures Procedures (including critical care time)  Medications Ordered in ED Medications  albuterol (VENTOLIN HFA) 108 (90 Base) MCG/ACT inhaler 2 puff (2 puffs  Inhalation Not Given 10/04/19 1014)  albuterol (VENTOLIN HFA) 108 (90 Base) MCG/ACT inhaler 4 puff (4 puffs Inhalation Given 10/04/19 0904)  ipratropium (ATROVENT HFA) inhaler 4 puff (4 puffs Inhalation Given 10/04/19 0904)  predniSONE (DELTASONE) tablet 60 mg (60 mg Oral  Given 10/04/19 0904)   ED Course  I have reviewed the triage vital signs and the nursing notes.  Pertinent labs & imaging results that were available during my care of the patient were reviewed by me and considered in my medical decision making (see chart for details).  Clinical Course as of Oct 04 931  Thu Oct 04, 2019  6808 Patient reassessed.  She is still having faint expiratory wheezes but overall her wheezing is significantly improved.  Patient states she is feeling much better.  Will give additional albuterol.  Will reassess.   [LJ]    Clinical Course User Index [LJ] Rayna Sexton, PA-C   MDM Rules/Calculators/A&P                          Pt is a 24 y.o. female that presents with a history, physical exam, and ED Clinical Course as noted above.   Patient presents today for an asthma exacerbation.  States she ran out of her albuterol inhaler last night.  Patient was given albuterol, ipratropium, oral prednisone in the ED.  She was reevaluated on multiple occasions and is no longer experiencing any wheezing.  Lungs are now clear to auscultation bilaterally.  O2 sats were rechecked and are now 96% on room air.  Patient states she is feeling much better and is eager to be discharged.  She does not have a PCP currently so I gave her follow-up with [facility].  She also has a follow-up appointment with her OB/GYN on Monday in regards to her current pregnancy.  I have sent new prescriptions for an albuterol inhaler as well as a prednisone burst.  Patient is hemodynamically stable and in NAD at the time of d/c. Evaluation does not show pathology that would require ongoing emergent intervention or  inpatient treatment. I explained the diagnosis to the patient. Patient is comfortable with above plan and is stable for discharge at this time. All questions were answered prior to disposition. Strict return precautions for returning to the ED were discussed. Encouraged follow up with PCP.    An After Visit Summary was printed and given to the patient.  Patient discharged to home/self care.  Condition at discharge: Stable  Note: Portions of this report may have been transcribed using voice recognition software. Every effort was made to ensure accuracy; however, inadvertent computerized transcription errors may be present.   Final Clinical Impression(s) / ED Diagnoses Final diagnoses:  Mild persistent asthma with exacerbation   Rx / DC Orders ED Discharge Orders         Ordered    albuterol (VENTOLIN HFA) 108 (90 Base) MCG/ACT inhaler  Every 4 hours PRN        10/04/19 0932    predniSONE (DELTASONE) 10 MG tablet  Daily        10/04/19 1018           Rayna Sexton, PA-C 10/04/19 1031    Fredia Sorrow, MD 10/26/19 1636

## 2019-10-22 DIAGNOSIS — O34219 Maternal care for unspecified type scar from previous cesarean delivery: Secondary | ICD-10-CM | POA: Insufficient documentation

## 2019-10-25 ENCOUNTER — Encounter: Payer: Medicaid Other | Admitting: Certified Nurse Midwife

## 2019-11-09 ENCOUNTER — Ambulatory Visit
Admission: EM | Admit: 2019-11-09 | Discharge: 2019-11-09 | Disposition: A | Discharge status: Home / Self Care | Payer: Medicaid Other | Attending: Emergency Medicine | Admitting: Emergency Medicine

## 2019-11-09 DIAGNOSIS — J4531 Mild persistent asthma with (acute) exacerbation: Secondary | ICD-10-CM

## 2019-11-09 MED ORDER — ALBUTEROL SULFATE HFA 108 (90 BASE) MCG/ACT IN AERS
2.0000 | INHALATION_SPRAY | Freq: Once | RESPIRATORY_TRACT | Status: AC
  Administered 2019-11-09: 2 via RESPIRATORY_TRACT

## 2019-11-09 NOTE — ED Triage Notes (Signed)
Pt states was at work and had a asthma attack and didn't have her inhaler. Pt presenting with audible wheezing and SOB. States had a cough with chest congestion yesterday. Pt given albuterol inhaler 2 puffs on arrival with O2 sat going from 85% to 92% and now speaking in complete sentences.

## 2019-11-09 NOTE — ED Provider Notes (Signed)
[facility]    CSN: [id number] Arrival date & time: 11/09/19  1613      History   Chief Complaint Chief Complaint  Patient presents with  . Asthma    HPI Tammy [name] is a 24 y.o. female  With history of asthma presenting for asthma exacerbation.  States began today and she did not have her inhaler as she left it in a family member's car.  Patient currently 3 months gestation: No abdominal pain, vaginal discharge or bleeding.  Denies chest pain, palpitations.  Does have shortness of breath in office.  No fever, recent URI, cough, leg swelling.  Past Medical History:  Diagnosis Date  . Arthritis   . Asthma    used inhaler 1 wk ago  . Chlamydia infection   . History of chicken pox   . Preterm delivery     Patient Active Problem List   Diagnosis Date Noted  . SAB (spontaneous abortion) 08/14/2019  . History of prior pregnancy with IUGR newborn 01/31/2019  . History of preterm delivery, currently pregnant   . Asthma, severe persistent, poorly-controlled 06/22/2012  . Asthma, chronic 06/21/2012  . History of unprotected sex 06/21/2012  . Depo-Provera contraceptive status 02/11/2012  . Asthma 11/05/2011  . Postpartum care following vaginal delivery 01/13/2011  . Chlamydia trachomatis infection of other genitourinary sites 01/13/2011    Past Surgical History:  Procedure Laterality Date  . CESAREAN SECTION    . NO PAST SURGERIES      OB History    Gravida  5   Para  3   Term  2   Preterm  1   AB      Living  3     SAB      TAB      Ectopic      Multiple      Live Births  3            Home Medications    Prior to Admission medications   Medication Sig Start Date End Date Taking? Authorizing Provider  albuterol (VENTOLIN HFA) 108 (90 Base) MCG/ACT inhaler Inhale 2 puffs into the lungs every 4 (four) hours as needed for wheezing or shortness of breath. 10/04/19   Rayna Sexton, PA-C  Blood Pressure Monitoring (BLOOD PRESSURE  KIT) DEVI 1 Device by Does not apply route as needed. 01/31/19   Constant, Peggy, MD    Family History Family History  Problem Relation Age of Onset  . Migraines Mother   . Mitral valve prolapse Mother   . Anemia Maternal Aunt   . Mitral valve prolapse Maternal Aunt   . Heart disease Paternal Uncle   . Mitral valve prolapse Maternal Grandmother   . Dementia Maternal Grandmother   . Kidney disease Paternal Grandmother        dialysis  . Cancer Paternal Grandfather        Lung    Social History Social History   Tobacco Use  . Smoking status: Former Smoker    Packs/day: 0.25    Types: Cigarettes  . Smokeless tobacco: Never Used  Vaping Use  . Vaping Use: Never used  Substance Use Topics  . Alcohol use: Not Currently    Comment: occ  . Drug use: No     Allergies   Pollen extract   Review of Systems As per HPI   Physical Exam Triage Vital Signs ED Triage Vitals  Enc Vitals Group     BP 11/09/19 1620 139/71  Pulse Rate 11/09/19 1620 96     Resp 11/09/19 1620 18     Temp 11/09/19 1620 98.2 F (36.8 C)     Temp Source 11/09/19 1620 Oral     SpO2 11/09/19 1620 92 %     Weight --      Height --      Head Circumference --      Peak Flow --      Pain Score 11/09/19 1621 7     Pain Loc --      Pain Edu? --      Excl. in [place]? --    No data found.  Updated Vital Signs BP 139/71 (BP Location: Left Arm)   Pulse 96   Temp 98.2 F (36.8 C) (Oral)   Resp 18   LMP 07/19/2019   SpO2 92%   Visual Acuity Right Eye Distance:   Left Eye Distance:   Bilateral Distance:    Right Eye Near:   Left Eye Near:    Bilateral Near:     Physical Exam Constitutional:      General: She is not in acute distress.    Appearance: She is not ill-appearing or diaphoretic.  HENT:     Head: Normocephalic and atraumatic.     Mouth/Throat:     Mouth: Mucous membranes are moist.     Pharynx: Oropharynx is clear. No oropharyngeal exudate or posterior oropharyngeal erythema.   Eyes:     General: No scleral icterus.    Conjunctiva/sclera: Conjunctivae normal.     Pupils: Pupils are equal, round, and reactive to light.  Neck:     Comments: Trachea midline, negative JVD Cardiovascular:     Rate and Rhythm: Normal rate and regular rhythm.     Heart sounds: No murmur heard.  No gallop.   Pulmonary:     Effort: Pulmonary effort is normal. No respiratory distress.     Breath sounds: Wheezing present. No rhonchi or rales.     Comments: SpO2 94% at bedside.  Wheezing is mild, diffuse.  No prolonged expiratory phase.  Good air entry bilaterally. Musculoskeletal:     Cervical back: Neck supple. No tenderness.  Lymphadenopathy:     Cervical: No cervical adenopathy.  Skin:    Capillary Refill: Capillary refill takes less than 2 seconds.     Coloration: Skin is not jaundiced or pale.     Findings: No rash.  Neurological:     General: No focal deficit present.     Mental Status: She is alert and oriented to person, place, and time.      UC Treatments / Results  Labs (all labs ordered are listed, but only abnormal results are displayed) Labs Reviewed - No data to display  EKG   Radiology No results found.  Procedures Procedures (including critical care time)  Medications Ordered in UC Medications  albuterol (VENTOLIN HFA) 108 (90 Base) MCG/ACT inhaler 2 puff (2 puffs Inhalation Given 11/09/19 1623)    Initial Impression / Assessment and Plan / UC Course  I have reviewed the triage vital signs and the nursing notes.  Pertinent labs & imaging results that were available during my care of the patient were reviewed by me and considered in my medical decision making (see chart for details).     Patient afebrile, nontoxic, with SpO2 85% on presentation. albuterol administered in office: Repeat pulse rate 99, oxygen 94%.  Declined Covid testing.  Will defer systemic steroids given improvement in symptoms and vitals  with albuterol as she is currently  pregnant.  ER eturn precautions discussed, patient verbalized understanding and is agreeable to plan. Final Clinical Impressions(s) / UC Diagnoses   Final diagnoses:  Mild persistent asthma with exacerbation   Discharge Instructions   None    ED Prescriptions    None     PDMP not reviewed this encounter.   Hall[place], [place], [place] 11/09/19 1647

## 2020-02-05 ENCOUNTER — Other Ambulatory Visit: Payer: Self-pay

## 2020-02-05 ENCOUNTER — Encounter: Payer: Self-pay | Admitting: Obstetrics

## 2020-02-05 ENCOUNTER — Ambulatory Visit (INDEPENDENT_AMBULATORY_CARE_PROVIDER_SITE_OTHER): Payer: Medicaid Other | Admitting: Obstetrics

## 2020-02-05 VITALS — BP 114/59 | HR 59 | Wt 136.0 lb

## 2020-02-05 DIAGNOSIS — Z3482 Encounter for supervision of other normal pregnancy, second trimester: Secondary | ICD-10-CM

## 2020-02-05 DIAGNOSIS — O0930 Supervision of pregnancy with insufficient antenatal care, unspecified trimester: Secondary | ICD-10-CM

## 2020-02-05 DIAGNOSIS — J45909 Unspecified asthma, uncomplicated: Secondary | ICD-10-CM

## 2020-02-05 DIAGNOSIS — O99519 Diseases of the respiratory system complicating pregnancy, unspecified trimester: Secondary | ICD-10-CM

## 2020-02-05 DIAGNOSIS — Z3A25 25 weeks gestation of pregnancy: Secondary | ICD-10-CM

## 2020-02-05 DIAGNOSIS — Z0000 Encounter for general adult medical examination without abnormal findings: Secondary | ICD-10-CM

## 2020-02-05 DIAGNOSIS — Z8759 Personal history of other complications of pregnancy, childbirth and the puerperium: Secondary | ICD-10-CM

## 2020-02-05 DIAGNOSIS — O0990 Supervision of high risk pregnancy, unspecified, unspecified trimester: Secondary | ICD-10-CM

## 2020-02-05 DIAGNOSIS — Z8751 Personal history of pre-term labor: Secondary | ICD-10-CM

## 2020-02-05 MED ORDER — VITAFOL GUMMIES 3.33-0.333-34.8 MG PO CHEW: 3.0000 | CHEWABLE_TABLET | Freq: Every day | ORAL | 11 refills | Status: AC

## 2020-02-05 MED ORDER — HYDROXYPROGESTERONE CAPROATE 275 MG/1.1ML ~~LOC~~ SOAJ
275.0000 mg | Freq: Once | SUBCUTANEOUS | Status: AC
  Administered 2020-02-05: 275 mg via SUBCUTANEOUS

## 2020-02-05 MED ORDER — ALBUTEROL SULFATE HFA 108 (90 BASE) MCG/ACT IN AERS: 2.0000 | INHALATION_SPRAY | RESPIRATORY_TRACT | 5 refills | Status: DC | PRN

## 2020-02-05 MED ORDER — HYDROXYPROGESTERONE CAPROATE 275 MG/1.1ML ~~LOC~~ SOAJ: 275.0000 mg | SUBCUTANEOUS | 14 refills | Status: DC

## 2020-02-05 MED ORDER — ASPIRIN 81 MG PO CHEW: 81.0000 mg | CHEWABLE_TABLET | Freq: Every day | ORAL | 5 refills | Status: DC

## 2020-02-05 NOTE — Progress Notes (Signed)
Subjective:    Tammy [name] is being seen today for her first obstetrical visit.  This is not a planned pregnancy. She is at [redacted]w[redacted]d gestation. Her obstetrical history is significant for intrauterine growth restriction (IUGR), macrosomia and preterm delivery. Relationship with FOB: not asked. Patient does intend to breast feed. Pregnancy history fully reviewed.  The information documented in the HPI was reviewed and verified.  Menstrual History: OB History    Gravida  5   Para  3   Term  2   Preterm  1   AB      Living  3     SAB      IAB      Ectopic      Multiple      Live Births  3            Patient's last menstrual period was 08/13/2019 (exact date).    Past Medical History:  Diagnosis Date  . Arthritis   . Asthma    used inhaler 1 wk ago  . Chlamydia infection   . History of chicken pox   . Preterm delivery     Past Surgical History:  Procedure Laterality Date  . CESAREAN SECTION    . NO PAST SURGERIES      (Not in a hospital admission)  Allergies  Allergen Reactions  . Pollen Extract Swelling    Social History   Tobacco Use  . Smoking status: Former Smoker    Packs/day: 0.25    Types: Cigarettes  . Smokeless tobacco: Never Used  Substance Use Topics  . Alcohol use: Not Currently    Comment: occ    Family History  Problem Relation Age of Onset  . Migraines Mother   . Mitral valve prolapse Mother   . Anemia Maternal Aunt   . Mitral valve prolapse Maternal Aunt   . Heart disease Paternal Uncle   . Mitral valve prolapse Maternal Grandmother   . Dementia Maternal Grandmother   . Kidney disease Paternal Grandmother        dialysis  . Cancer Paternal Grandfather        Lung     Review of Systems Constitutional: negative for weight loss Gastrointestinal: negative for vomiting Genitourinary:negative for genital lesions and vaginal discharge and dysuria Musculoskeletal:negative for back pain Behavioral/Psych: negative for abusive  relationship, depression, illegal drug usage and tobacco use    Objective:    BP (!) 114/59   Pulse (!) 59   Wt 136 lb (61.7 kg)   LMP 08/13/2019 (Exact Date)   BMI 24.09 kg/m  General Appearance:    Alert, cooperative, no distress, appears stated age  Head:    Normocephalic, without obvious abnormality, atraumatic  Eyes:    PERRL, conjunctiva/corneas clear, EOM's intact, fundi    benign, both eyes  Ears:    Normal TM's and external ear canals, both ears  Nose:   Nares normal, septum midline, mucosa normal, no drainage    or sinus tenderness  Throat:   Lips, mucosa, and tongue normal; teeth and gums normal  Neck:   Supple, symmetrical, trachea midline, no adenopathy;    thyroid:  no enlargement/tenderness/nodules; no carotid   bruit or JVD  Back:     Symmetric, no curvature, ROM normal, no CVA tenderness  Lungs:     Clear to auscultation bilaterally, respirations unlabored  Chest Wall:    No tenderness or deformity   Heart:    Regular rate and rhythm, S1 and  S2 normal, no murmur, rub   or gallop  Breast Exam:    No tenderness, masses, or nipple abnormality  Abdomen:     Soft, non-tender, bowel sounds active all four quadrants,    no masses, no organomegaly  Genitalia:    Normal female without lesion, discharge or tenderness  Extremities:   Extremities normal, atraumatic, no cyanosis or edema  Pulses:   2+ and symmetric all extremities  Skin:   Skin color, texture, turgor normal, no rashes or lesions  Lymph nodes:   Cervical, supraclavicular, and axillary nodes normal  Neurologic:   CNII-XII intact, normal strength, sensation and reflexes    throughout      Lab Review Urine pregnancy test Labs reviewed yes Radiologic studies reviewed yes  Assessment:    Pregnancy at [redacted]w[redacted]d weeks    Plan:     1. Supervision of high risk pregnancy, antepartum Rx: - Prenatal Vit-Fe Phos-FA-Omega (VITAFOL GUMMIES) 3.33-0.333-34.8 MG CHEW; Chew 3 tablets by mouth daily.  Dispense: 90 tablet;  Refill: 11  2. Late prenatal care, antepartum - transfer to [facility] at 25 weeks from [facility]  3. History of preterm delivery Rx: - HYDROXYprogesterone caproate (Makena) autoinjector 275 mg - HYDROXYprogesterone caproate (MAKENA) autoinjector; Inject 275 mg into the skin every 7 (seven) days.  Dispense: 1.05 mL; Refill: 14  4. History of prior pregnancy with IUGR newborn Rx: - [place] MFM OB DETAIL +14 WK; Future - aspirin 81 MG chewable tablet; Chew 1 tablet (81 mg total) by mouth daily.  Dispense: 30 tablet; Refill: 5  5. Asthma affecting pregnancy, antepartum Rx: - albuterol (VENTOLIN HFA) 108 (90 Base) MCG/ACT inhaler; Inhale 2 puffs into the lungs every 4 (four) hours as needed for wheezing or shortness of breath.  Dispense: 6.7 g; Refill: 5  6. Routine adult health maintenance Rx: - Ambulatory referral to Internal Medicine   Prenatal vitamins.  Counseling provided regarding continued use of seat belts, cessation of alcohol consumption, smoking or use of illicit drugs; infection precautions i.e., influenza/TDAP immunizations, toxoplasmosis,CMV, parvovirus, listeria and varicella; workplace safety, exercise during pregnancy; routine dental care, safe medications, sexual activity, hot tubs, saunas, pools, travel, caffeine use, fish and methlymercury, potential toxins, hair treatments, varicose veins Weight gain recommendations per IOM guidelines reviewed: underweight/BMI< 18.5--> gain 28 - 40 lbs; normal weight/BMI 18.5 - 24.9--> gain 25 - 35 lbs; overweight/BMI 25 - 29.9--> gain 15 - 25 lbs; obese/BMI >30->gain  11 - 20 lbs Problem list reviewed and updated. FIRST/CF mutation testing/NIPT/QUAD SCREEN/fragile X/Ashkenazi Jewish population testing/Spinal muscular atrophy discussed: done. Role of ultrasound in pregnancy discussed; fetal survey: requested. Amniocentesis discussed: not indicated.  Meds ordered this encounter  Medications  . Prenatal Vit-Fe Phos-FA-Omega (VITAFOL GUMMIES)  3.33-0.333-34.8 MG CHEW    Sig: Chew 3 tablets by mouth daily.    Dispense:  90 tablet    Refill:  11  . HYDROXYprogesterone caproate (Makena) autoinjector 275 mg  . HYDROXYprogesterone caproate (MAKENA) autoinjector    Sig: Inject 275 mg into the skin every 7 (seven) days.    Dispense:  1.05 mL    Refill:  14  . albuterol (VENTOLIN HFA) 108 (90 Base) MCG/ACT inhaler    Sig: Inhale 2 puffs into the lungs every 4 (four) hours as needed for wheezing or shortness of breath.    Dispense:  6.7 g    Refill:  5   Orders Placed This Encounter  Procedures  . [place] MFM OB DETAIL +14 WK    Standing Status:  Future    Standing Expiration Date:   02/04/2021    Order Specific Question:   Reason for Exam (SYMPTOM  OR DIAGNOSIS REQUIRED)    Answer:   History of IUGR    Order Specific Question:   Preferred Location    Answer:   [facility]  . Ambulatory referral to Internal Medicine    Referral Priority:   Routine    Referral Type:   Consultation    Referral Reason:   Specialty Services Required    Requested Specialty:   Internal Medicine    Number of Visits Requested:   1    Follow up in 3 weeks.   50% of 20 min visit spent on counseling and coordination of care.     Brock Bad, MD 02/05/2020 4:01 PM

## 2020-02-12 ENCOUNTER — Ambulatory Visit (INDEPENDENT_AMBULATORY_CARE_PROVIDER_SITE_OTHER): Payer: Medicaid Other

## 2020-02-12 ENCOUNTER — Other Ambulatory Visit: Payer: Self-pay

## 2020-02-12 DIAGNOSIS — Z8751 Personal history of pre-term labor: Secondary | ICD-10-CM

## 2020-02-12 MED ORDER — HYDROXYPROGESTERONE CAPROATE 275 MG/1.1ML ~~LOC~~ SOAJ
275.0000 mg | Freq: Once | SUBCUTANEOUS | Status: AC
  Administered 2020-02-12: 275 mg via SUBCUTANEOUS

## 2020-02-12 NOTE — Progress Notes (Signed)
Patient presents for 17-p injection. Injection given in left arm. Pt tolerated well.

## 2020-02-14 ENCOUNTER — Ambulatory Visit
Admission: EM | Admit: 2020-02-14 | Discharge: 2020-02-14 | Disposition: A | Discharge status: Home / Self Care | Payer: Medicaid Other | Attending: Urgent Care | Admitting: Urgent Care

## 2020-02-14 ENCOUNTER — Other Ambulatory Visit: Payer: Self-pay

## 2020-02-14 ENCOUNTER — Encounter: Payer: Self-pay | Admitting: Emergency Medicine

## 2020-02-14 DIAGNOSIS — J45909 Unspecified asthma, uncomplicated: Secondary | ICD-10-CM

## 2020-02-14 DIAGNOSIS — J4540 Moderate persistent asthma, uncomplicated: Secondary | ICD-10-CM | POA: Diagnosis not present

## 2020-02-14 DIAGNOSIS — R0602 Shortness of breath: Secondary | ICD-10-CM

## 2020-02-14 DIAGNOSIS — R062 Wheezing: Secondary | ICD-10-CM

## 2020-02-14 MED ORDER — ALBUTEROL SULFATE HFA 108 (90 BASE) MCG/ACT IN AERS: 2.0000 | INHALATION_SPRAY | RESPIRATORY_TRACT | 0 refills | Status: DC | PRN

## 2020-02-14 MED ORDER — ALBUTEROL SULFATE HFA 108 (90 BASE) MCG/ACT IN AERS
2.0000 | INHALATION_SPRAY | Freq: Once | RESPIRATORY_TRACT | Status: AC
  Administered 2020-02-14: 2 via RESPIRATORY_TRACT

## 2020-02-14 NOTE — ED Provider Notes (Signed)
[place]   MRN: [id number] DOB: 03/28/1995  Subjective:   Tammy [name] is a 25 y.o. female presenting for refill of her asthma inhaler.  Patient states that she just got it filled and ran out of it.  She is requesting a refill here in the clinic.  States that they don't provided to her at the pharmacy if she has already gotten one in the past month.  Denies fever, chest pain, cough.  She is just having her typical asthma symptoms of shortness of breath and wheezing.  She is not Covid vaccinated.  Refuses testing today.  No current facility-administered medications for this encounter.  Current Outpatient Medications:  .  albuterol (VENTOLIN HFA) 108 (90 Base) MCG/ACT inhaler, Inhale 2 puffs into the lungs every 4 (four) hours as needed for wheezing or shortness of breath., Disp: 6.7 g, Rfl: 5 .  aspirin 81 MG chewable tablet, Chew 1 tablet (81 mg total) by mouth daily., Disp: 30 tablet, Rfl: 5 .  Blood Pressure Monitoring (BLOOD PRESSURE KIT) DEVI, 1 Device by Does not apply route as needed., Disp: 1 each, Rfl: 0 .  HYDROXYprogesterone caproate (MAKENA) autoinjector, Inject 275 mg into the skin every 7 (seven) days., Disp: 1.05 mL, Rfl: 14 .  Prenatal Vit-Fe Phos-FA-Omega (VITAFOL GUMMIES) 3.33-0.333-34.8 MG CHEW, Chew 3 tablets by mouth daily., Disp: 90 tablet, Rfl: 11   Allergies  Allergen Reactions  . Pollen Extract Swelling    Past Medical History:  Diagnosis Date  . Arthritis   . Asthma    used inhaler 1 wk ago  . Chlamydia infection   . History of chicken pox   . Preterm delivery      Past Surgical History:  Procedure Laterality Date  . CESAREAN SECTION    . NO PAST SURGERIES      Family History  Problem Relation Age of Onset  . Migraines Mother   . Mitral valve prolapse Mother   . Anemia Maternal Aunt   . Mitral valve prolapse Maternal Aunt   . Heart disease Paternal Uncle   . Mitral valve prolapse Maternal Grandmother   . Dementia Maternal  Grandmother   . Kidney disease Paternal Grandmother        dialysis  . Cancer Paternal Grandfather        Lung    Social History   Tobacco Use  . Smoking status: Former Smoker    Packs/day: 0.25    Types: Cigarettes  . Smokeless tobacco: Never Used  Vaping Use  . Vaping Use: Never used  Substance Use Topics  . Alcohol use: Not Currently    Comment: occ  . Drug use: No    ROS   Objective:   Vitals: BP 115/82 (BP Location: Right Arm)   Pulse 79   Temp 98.1 F (36.7 C) (Oral)   Resp 16   LMP 08/13/2019 (Exact Date)   SpO2 98%   Physical Exam Constitutional:      General: She is not in acute distress.    Appearance: Normal appearance. She is well-developed. She is not ill-appearing, toxic-appearing or diaphoretic.  HENT:     Head: Normocephalic and atraumatic.     Nose: Nose normal.     Mouth/Throat:     Mouth: Mucous membranes are moist.     Pharynx: Oropharynx is clear.  Eyes:     General: No scleral icterus.    Extraocular Movements: Extraocular movements intact.     Pupils: Pupils are equal, round, and  reactive to light.  Cardiovascular:     Rate and Rhythm: Normal rate and regular rhythm.     Pulses: Normal pulses.     Heart sounds: Normal heart sounds. No murmur heard. No friction rub. No gallop.   Pulmonary:     Effort: Pulmonary effort is normal. No respiratory distress.     Breath sounds: No stridor. Examination of the right-lower field reveals wheezing. Examination of the left-lower field reveals wheezing. Decreased breath sounds (slightly decreased in lower lung fields) and wheezing (mild) present. No rhonchi or rales.  Skin:    General: Skin is warm and dry.     Findings: No rash.  Neurological:     General: No focal deficit present.     Mental Status: She is alert and oriented to person, place, and time.  Psychiatric:        Mood and Affect: Mood normal.        Behavior: Behavior normal.        Thought Content: Thought content normal.       Assessment and Plan :   PDMP not reviewed this encounter.  1. Moderate persistent asthma without complication   2. Shortness of breath   3. Wheezing   4. Asthma affecting pregnancy, antepartum     Refilled her albuterol inhaler in clinic and script sent to the pharmacy as well.  Encouraged patient to reconsider COVID-19 testing if her symptoms persist.  Will hold off on a prednisone course today. Counseled patient on potential for adverse effects with medications prescribed/recommended today, ER and return-to-clinic precautions discussed, patient verbalized understanding.    Jaynee Eagles, PA-C 02/14/20 1038

## 2020-02-14 NOTE — ED Triage Notes (Signed)
Pt presents today with SOB and requests refill for Albuterol inhaler. She reports having an "asthma attack while at home this a.m. (now resolved).

## 2020-02-15 ENCOUNTER — Ambulatory Visit: Payer: Medicaid Other | Attending: Obstetrics

## 2020-02-15 ENCOUNTER — Ambulatory Visit: Payer: Medicaid Other | Admitting: *Deleted

## 2020-02-15 ENCOUNTER — Encounter: Payer: Self-pay | Admitting: *Deleted

## 2020-02-15 VITALS — BP 130/65 | HR 72

## 2020-02-15 DIAGNOSIS — Z363 Encounter for antenatal screening for malformations: Secondary | ICD-10-CM

## 2020-02-15 DIAGNOSIS — O09292 Supervision of pregnancy with other poor reproductive or obstetric history, second trimester: Secondary | ICD-10-CM

## 2020-02-15 DIAGNOSIS — O34212 Maternal care for vertical scar from previous cesarean delivery: Secondary | ICD-10-CM

## 2020-02-15 DIAGNOSIS — O402XX0 Polyhydramnios, second trimester, not applicable or unspecified: Secondary | ICD-10-CM | POA: Diagnosis not present

## 2020-02-15 DIAGNOSIS — Z3A26 26 weeks gestation of pregnancy: Secondary | ICD-10-CM

## 2020-02-15 DIAGNOSIS — O365920 Maternal care for other known or suspected poor fetal growth, second trimester, not applicable or unspecified: Secondary | ICD-10-CM | POA: Diagnosis not present

## 2020-02-15 DIAGNOSIS — J45909 Unspecified asthma, uncomplicated: Secondary | ICD-10-CM

## 2020-02-15 DIAGNOSIS — O99512 Diseases of the respiratory system complicating pregnancy, second trimester: Secondary | ICD-10-CM

## 2020-02-15 DIAGNOSIS — O09212 Supervision of pregnancy with history of pre-term labor, second trimester: Secondary | ICD-10-CM

## 2020-02-15 DIAGNOSIS — Z8759 Personal history of other complications of pregnancy, childbirth and the puerperium: Secondary | ICD-10-CM | POA: Diagnosis present

## 2020-02-18 ENCOUNTER — Other Ambulatory Visit: Payer: Self-pay | Admitting: *Deleted

## 2020-02-19 ENCOUNTER — Ambulatory Visit: Payer: Medicaid Other

## 2020-02-20 NOTE — Progress Notes (Signed)
Patient ID: Noel Gerold, female   DOB: 1995-08-11, 25 y.o.   MRN: [id number] Patient was assessed and managed by nursing staff during this encounter. I have reviewed the chart and agree with the documentation and plan. I have also made any necessary editorial changes.  Scheryl Darter, MD 02/20/2020 11:36 AM

## 2020-02-27 ENCOUNTER — Encounter: Payer: Medicaid Other | Admitting: Women's Health

## 2020-02-27 ENCOUNTER — Other Ambulatory Visit: Payer: Medicaid Other

## 2020-02-29 ENCOUNTER — Other Ambulatory Visit: Payer: Medicaid Other

## 2020-02-29 ENCOUNTER — Ambulatory Visit: Payer: Medicaid Other

## 2020-03-05 ENCOUNTER — Inpatient Hospital Stay (HOSPITAL_COMMUNITY)
Admission: AD | Admit: 2020-03-05 | Discharge: 2020-03-05 | Disposition: A | Discharge status: Home / Self Care | Payer: Medicaid Other | Attending: Obstetrics & Gynecology | Admitting: Obstetrics & Gynecology

## 2020-03-05 ENCOUNTER — Encounter (HOSPITAL_COMMUNITY): Payer: Self-pay | Admitting: Obstetrics & Gynecology

## 2020-03-05 ENCOUNTER — Other Ambulatory Visit: Payer: Self-pay

## 2020-03-05 DIAGNOSIS — Z87891 Personal history of nicotine dependence: Secondary | ICD-10-CM | POA: Diagnosis not present

## 2020-03-05 DIAGNOSIS — Z3A29 29 weeks gestation of pregnancy: Secondary | ICD-10-CM | POA: Diagnosis not present

## 2020-03-05 DIAGNOSIS — Z79899 Other long term (current) drug therapy: Secondary | ICD-10-CM | POA: Diagnosis not present

## 2020-03-05 DIAGNOSIS — Z3689 Encounter for other specified antenatal screening: Secondary | ICD-10-CM

## 2020-03-05 DIAGNOSIS — O4703 False labor before 37 completed weeks of gestation, third trimester: Secondary | ICD-10-CM | POA: Insufficient documentation

## 2020-03-05 DIAGNOSIS — O99891 Other specified diseases and conditions complicating pregnancy: Secondary | ICD-10-CM | POA: Diagnosis not present

## 2020-03-05 DIAGNOSIS — O479 False labor, unspecified: Secondary | ICD-10-CM

## 2020-03-05 DIAGNOSIS — B9689 Other specified bacterial agents as the cause of diseases classified elsewhere: Secondary | ICD-10-CM

## 2020-03-05 DIAGNOSIS — N760 Acute vaginitis: Secondary | ICD-10-CM | POA: Diagnosis not present

## 2020-03-05 DIAGNOSIS — J45909 Unspecified asthma, uncomplicated: Secondary | ICD-10-CM | POA: Diagnosis not present

## 2020-03-05 DIAGNOSIS — O23593 Infection of other part of genital tract in pregnancy, third trimester: Secondary | ICD-10-CM | POA: Insufficient documentation

## 2020-03-05 DIAGNOSIS — O99513 Diseases of the respiratory system complicating pregnancy, third trimester: Secondary | ICD-10-CM | POA: Diagnosis not present

## 2020-03-05 LAB — GC/CHLAMYDIA PROBE AMP (~~LOC~~) NOT AT ARMC
Chlamydia: NEGATIVE
Comment: NEGATIVE
Comment: NORMAL
Neisseria Gonorrhea: NEGATIVE

## 2020-03-05 LAB — URINALYSIS, ROUTINE W REFLEX MICROSCOPIC
Bilirubin Urine: NEGATIVE
Glucose, UA: NEGATIVE mg/dL
Hgb urine dipstick: NEGATIVE
Ketones, ur: NEGATIVE mg/dL
Nitrite: NEGATIVE
Protein, ur: NEGATIVE mg/dL
Specific Gravity, Urine: 1.011 (ref 1.005–1.030)
pH: 6 (ref 5.0–8.0)

## 2020-03-05 LAB — WET PREP, GENITAL
Sperm: NONE SEEN
Trich, Wet Prep: NONE SEEN
Yeast Wet Prep HPF POC: NONE SEEN

## 2020-03-05 LAB — POCT FERN TEST: POCT Fern Test: NEGATIVE

## 2020-03-05 MED ORDER — METRONIDAZOLE 500 MG PO TABS: 500.0000 mg | ORAL_TABLET | Freq: Two times a day (BID) | ORAL | 0 refills | Status: DC

## 2020-03-05 NOTE — MAU Provider Note (Signed)
History     CSN: [id number]  Arrival date and time: 03/05/20 0031     Chief Complaint  Patient presents with  . Vaginal Discharge   25 y.o. [id number] '@29' .2 wks presenting with vaginal discharge. Reports onset about 1 week ago. Describes as yellow and thin with malodor. Reports it feeling wet on her underwear and thighs. No gush of fluid. No new partner. No concern for STI but hx of Chlamydia earlier in the pregnancy with same partner. Denies VB. Also reports abdominal tightening q5 min tonight. Reports good FM.    OB History    Gravida  5   Para  3   Term  2   Preterm  1   AB  1   Living  3     SAB  1   IAB      Ectopic      Multiple      Live Births  3           Past Medical History:  Diagnosis Date  . Arthritis   . Asthma    used inhaler 1 wk ago  . Chlamydia infection   . History of chicken pox   . Preterm delivery     Past Surgical History:  Procedure Laterality Date  . CESAREAN SECTION    . NO PAST SURGERIES      Family History  Problem Relation Age of Onset  . Migraines Mother   . Mitral valve prolapse Mother   . Anemia Maternal Aunt   . Mitral valve prolapse Maternal Aunt   . Heart disease Paternal Uncle   . Mitral valve prolapse Maternal Grandmother   . Dementia Maternal Grandmother   . Kidney disease Paternal Grandmother        dialysis  . Cancer Paternal Grandfather        Lung    Social History   Tobacco Use  . Smoking status: Former Smoker    Packs/day: 0.25    Types: Cigarettes  . Smokeless tobacco: Never Used  Vaping Use  . Vaping Use: Never used  Substance Use Topics  . Alcohol use: Not Currently    Comment: occ  . Drug use: No    Allergies:  Allergies  Allergen Reactions  . Pollen Extract Swelling    Medications Prior to Admission  Medication Sig Dispense Refill Last Dose  . albuterol (VENTOLIN HFA) 108 (90 Base) MCG/ACT inhaler Inhale 2 puffs into the lungs every 4 (four) hours as needed for wheezing or  shortness of breath. 18 g 0 03/05/2020 at Unknown time  . aspirin 81 MG chewable tablet Chew 1 tablet (81 mg total) by mouth daily. (Patient not taking: Reported on 02/15/2020) 30 tablet 5   . Blood Pressure Monitoring (BLOOD PRESSURE KIT) DEVI 1 Device by Does not apply route as needed. 1 each 0   . HYDROXYprogesterone caproate (MAKENA) autoinjector Inject 275 mg into the skin every 7 (seven) days. 1.05 mL 14   . Prenatal Vit-Fe Phos-FA-Omega (VITAFOL GUMMIES) 3.33-0.333-34.8 MG CHEW Chew 3 tablets by mouth daily. 90 tablet 11     Review of Systems  Gastrointestinal: Positive for abdominal pain.  Genitourinary: Positive for vaginal discharge. Negative for vaginal bleeding.   Physical Exam   Blood pressure 134/69, pulse 73, temperature 98.2 F (36.8 C), temperature source Oral, resp. rate 17, height '5\' 3"'  (1.6 m), weight 62.2 kg, last menstrual period 08/13/2019, SpO2 99 %, unknown if currently breastfeeding.  Physical Exam Vitals and nursing note  reviewed. Exam conducted with a chaperone present.  Constitutional:      General: She is not in acute distress.    Appearance: Normal appearance.  HENT:     Head: Normocephalic and atraumatic.  Cardiovascular:     Rate and Rhythm: Normal rate.  Pulmonary:     Effort: Pulmonary effort is normal. No respiratory distress.  Abdominal:     Palpations: Abdomen is soft.     Tenderness: There is no abdominal tenderness.     Comments: gravid  Genitourinary:    Comments: SSE: thin yellow discharge, no pool SVE: closed/thick Musculoskeletal:        General: Normal range of motion.     Cervical back: Normal range of motion.  Skin:    General: Skin is warm and dry.  Neurological:     General: No focal deficit present.     Mental Status: She is alert and oriented to person, place, and time.  Psychiatric:        Mood and Affect: Mood normal.        Behavior: Behavior normal.   EFM: 130 bpm, mod variability, + accels, no decels Toco:  UI  Results for orders placed or performed during the hospital encounter of 03/05/20 (from the past 24 hour(s))  Urinalysis, Routine w reflex microscopic Urine, Clean Catch     Status: Abnormal   Collection Time: 03/05/20 12:43 AM  Result Value Ref Range   Color, Urine YELLOW YELLOW   APPearance HAZY (A) CLEAR   Specific Gravity, Urine 1.011 1.005 - 1.030   pH 6.0 5.0 - 8.0   Glucose, UA NEGATIVE NEGATIVE mg/dL   Hgb urine dipstick NEGATIVE NEGATIVE   Bilirubin Urine NEGATIVE NEGATIVE   Ketones, ur NEGATIVE NEGATIVE mg/dL   Protein, ur NEGATIVE NEGATIVE mg/dL   Nitrite NEGATIVE NEGATIVE   Leukocytes,Ua LARGE (A) NEGATIVE   RBC / HPF 0-5 0 - 5 RBC/hpf   WBC, UA 11-20 0 - 5 WBC/hpf   Bacteria, UA RARE (A) NONE SEEN   Squamous Epithelial / LPF 21-50 0 - 5  Wet prep, genital     Status: Abnormal   Collection Time: 03/05/20  1:03 AM   Specimen: Vaginal  Result Value Ref Range   Yeast Wet Prep HPF POC NONE SEEN NONE SEEN   Trich, Wet Prep NONE SEEN NONE SEEN   Clue Cells Wet Prep HPF POC PRESENT (A) NONE SEEN   WBC, Wet Prep HPF POC MANY (A) NONE SEEN   Sperm NONE SEEN   Fern Test     Status: None   Collection Time: 03/05/20  1:35 AM  Result Value Ref Range   POCT Fern Test Negative = intact amniotic membranes     MAU Course  Procedures  MDM Review of care everywhere> late prenatal care, polyhydramnios, SGA, hx of PTB '@32'  wks, hx of CS. No signs of SROM or PTL. Will treat BV. Stable for discharge home.  Assessment and Plan   1. [redacted] weeks gestation of pregnancy   2. NST (non-stress test) reactive   3. Bacterial vaginosis   4. Braxton Hicks contractions    Discharge home Follow up at [facility] as scheduled PTL precautions Rx Flagyl  Allergies as of 03/05/2020      Reactions   Pollen Extract Swelling      Medication List    TAKE these medications   albuterol 108 (90 Base) MCG/ACT inhaler Commonly known as: VENTOLIN HFA Inhale 2 puffs into the lungs every 4 (four)  hours as needed for wheezing or shortness of breath.   aspirin 81 MG chewable tablet Chew 1 tablet (81 mg total) by mouth daily.   Blood Pressure Kit Devi 1 Device by Does not apply route as needed.   HYDROXYprogesterone caproate autoinjector Commonly known as: Makena Inject 275 mg into the skin every 7 (seven) days.   metroNIDAZOLE 500 MG tablet Commonly known as: FLAGYL Take 1 tablet (500 mg total) by mouth 2 (two) times daily.   Vitafol Gummies 3.33-0.333-34.8 MG Chew Chew 3 tablets by mouth daily.      Julianne Handler, CNM 03/05/2020, 2:07 AM

## 2020-03-05 NOTE — Discharge Instructions (Signed)
Bacterial Vaginosis  Bacterial vaginosis is an infection of the vagina. It happens when too many normal germs (healthy bacteria) grow in the vagina. This infection can make it easier to get other infections from sex (STIs). It is very important for pregnant women to get treated. This infection can cause babies to be born early or at a low birth weight. What are the causes? This infection is caused by an increase in certain germs that grow in the vagina. You cannot get this infection from toilet seats, bedsheets, swimming pools, or things that touch your vagina. What increases the risk?  Having sex with a new person or more than one person.  Having sex without protection.  Douching.  Having an intrauterine device (IUD).  Smoking.  Using drugs or drinking alcohol. These can lead you to do things that are risky.  Taking certain antibiotic medicines.  Being pregnant. What are the signs or symptoms? Some women have no symptoms. Symptoms may include:  A discharge from your vagina. It may be gray or white. It can be watery or foamy.  A fishy smell. This can happen after sex or during your menstrual period.  Itching in and around your vagina.  A feeling of burning or pain when you pee (urinate). How is this treated? This infection is treated with antibiotic medicines. These may be given to you as:  A pill.  A cream for your vagina.  A medicine that you put into your vagina (suppository). If the infection comes back after treatment, you may need more antibiotics. Follow these instructions at home: Medicines  Take over-the-counter and prescription medicines as told by your doctor.  Take or use your antibiotic medicine as told by your doctor. Do not stop taking or using it, even if you start to feel better. General instructions  If the person you have sex with is a woman, tell her that you have this infection. She will need to follow up with her doctor. If you have a female  partner, he does not need to be treated.  Do not have sex until you finish treatment.  Drink enough fluid to keep your pee pale yellow.  Keep your vagina and butt clean. ? Wash the area with warm water each day. ? Wipe from front to back after you use the toilet.  If you are breastfeeding a baby, ask your doctor if you should keep doing so during treatment.  Keep all follow-up visits. How is this prevented? Self-care  Do not douche.  Use only warm water to wash around your vagina.  Wear underwear that is cotton or lined with cotton.  Do not wear tight pants and pantyhose, especially in the summer. Safe sex  Use protection when you have sex. This includes: ? Use condoms. ? Use dental dams. This is a thin layer that protects the mouth during oral sex.  Limit how many people you have sex with. To prevent this infection, it is best to have sex with just one person.  Get tested for STIs. The person you have sex with should also get tested. Drugs and alcohol  Do not smoke or use any products that contain nicotine or tobacco. If you need help quitting, ask your doctor.  Do not use drugs.  Do not drink alcohol if: ? Your doctor tells you not to drink. ? You are pregnant, may be pregnant, or are planning to become pregnant.  If you drink alcohol: ? Limit how much you have to 0-1 drink   a day. ? Know how much alcohol is in your drink. In the [place] one drink equals one 12 oz bottle of beer (355 mL), one 5 oz glass of wine (148 mL), or one 1 oz glass of hard liquor (44 mL). Where to find more information  Centers for Disease Control and Prevention: FootballExhibition.com.br  American Sexual Health Association: www.ashastd.org  Office on [employer] [facility]: http://hoffman.com/ Contact a doctor if:  Your symptoms do not get better, even after you are treated.  You have more discharge or pain when you pee.  You have a fever or chills.  You have pain in your belly (abdomen) or in the area  between your hips.  You have pain with sex.  You bleed from your vagina between menstrual periods. Summary  This infection can happen when too many germs (bacteria) grow in the vagina.  This infection can make it easier to get infections from sex (STIs). Treating this can lower that chance.  Get treated if you are pregnant. This infection can cause babies to be born early.  Do not stop taking or using your antibiotic medicine, even if you start to feel better. This information is not intended to replace advice given to you by your health care provider. Make sure you discuss any questions you have with your health care provider. Document Revised: 07/05/2019 Document Reviewed: 07/05/2019 Elsevier Patient Education  2021 Elsevier Inc.   Rosen's Emergency Medicine: Concepts and Clinical Practice (9th ed., pp. 2296- 2312). Elsevier.">  Braxton Hicks Contractions Contractions of the uterus can occur throughout pregnancy, but they are not always a sign that you are in labor. You may have practice contractions called Braxton Hicks contractions. These false labor contractions are sometimes confused with true labor. What are Deberah Pelton contractions? Braxton Hicks contractions are tightening movements that occur in the muscles of the uterus before labor. Unlike true labor contractions, these contractions do not result in opening (dilation) and thinning of the cervix. Toward the end of pregnancy (32-34 weeks), Braxton Hicks contractions can happen more often and may become stronger. These contractions are sometimes difficult to tell apart from true labor because they can be very uncomfortable. You should not feel embarrassed if you go to the hospital with false labor. Sometimes, the only way to tell if you are in true labor is for your health care provider to look for changes in the cervix. The health care provider will do a physical exam and may monitor your contractions. If you are not in true  labor, the exam should show that your cervix is not dilating and your water has not broken. If there are no other health problems associated with your pregnancy, it is completely safe for you to be sent home with false labor. You may continue to have Braxton Hicks contractions until you go into true labor. How to tell the difference between true labor and false labor True labor  Contractions last 30-70 seconds.  Contractions become very regular.  Discomfort is usually felt in the top of the uterus, and it spreads to the lower abdomen and low back.  Contractions do not go away with walking.  Contractions usually become more intense and increase in frequency.  The cervix dilates and gets thinner. False labor  Contractions are usually shorter and not as strong as true labor contractions.  Contractions are usually irregular.  Contractions are often felt in the front of the lower abdomen and in the groin.  Contractions may go away when you walk  around or change positions while lying down.  Contractions get weaker and are shorter-lasting as time goes on.  The cervix usually does not dilate or become thin. Follow these instructions at home:  Take over-the-counter and prescription medicines only as told by your health care provider.  Keep up with your usual exercises and follow other instructions from your health care provider.  Eat and drink lightly if you think you are going into labor.  If Braxton Hicks contractions are making you uncomfortable: ? Change your position from lying down or resting to walking, or change from walking to resting. ? Sit and rest in a tub of warm water. ? Drink enough fluid to keep your urine pale yellow. Dehydration may cause these contractions. ? Do slow and deep breathing several times an hour.  Keep all follow-up prenatal visits as told by your health care provider. This is important.   Contact a health care provider if:  You have a fever.  You  have continuous pain in your abdomen. Get help right away if:  Your contractions become stronger, more regular, and closer together.  You have fluid leaking or gushing from your vagina.  You pass blood-tinged mucus (bloody show).  You have bleeding from your vagina.  You have low back pain that you never had before.  You feel your baby's head pushing down and causing pelvic pressure.  Your baby is not moving inside you as much as it used to. Summary  Contractions that occur before labor are called Braxton Hicks contractions, false labor, or practice contractions.  Braxton Hicks contractions are usually shorter, weaker, farther apart, and less regular than true labor contractions. True labor contractions usually become progressively stronger and regular, and they become more frequent.  Manage discomfort from [facility] contractions by changing position, resting in a warm bath, drinking plenty of water, or practicing deep breathing. This information is not intended to replace advice given to you by your health care provider. Make sure you discuss any questions you have with your health care provider. Document Revised: 12/17/2016 Document Reviewed: 05/20/2016 Elsevier Patient Education  2021 [employer].

## 2020-03-05 NOTE — MAU Note (Addendum)
...  Tammy [name] is a 25 y.o. at [redacted]w[redacted]d here in MAU reporting: thick yellow vaginal discharge that has been ongoing for two days now that has a foul smell. She reports having to wear a panty liner due to the amount. No burning, itching, soreness, or vaginal irritation. She endorses seeing vaginal spotting when she would wipe two days ago but has not since had any. Experiencing lower abdominal "pressure" that is intermittent. +FM. No VB.    Pain: lower abdominal 7/10 Last intercourse one week ago. FHT: 131 doppler Lab orders placed from triage:  UA

## 2020-03-07 ENCOUNTER — Ambulatory Visit: Payer: Medicaid Other

## 2020-03-09 ENCOUNTER — Other Ambulatory Visit: Payer: Self-pay

## 2020-03-09 ENCOUNTER — Encounter (HOSPITAL_COMMUNITY): Payer: Self-pay | Admitting: Emergency Medicine

## 2020-03-09 ENCOUNTER — Emergency Department (HOSPITAL_COMMUNITY)
Admission: EM | Admit: 2020-03-09 | Discharge: 2020-03-09 | Disposition: A | Discharge status: Home / Self Care | Payer: Medicaid Other | Attending: Emergency Medicine | Admitting: Emergency Medicine

## 2020-03-09 DIAGNOSIS — R059 Cough, unspecified: Secondary | ICD-10-CM | POA: Insufficient documentation

## 2020-03-09 DIAGNOSIS — O99513 Diseases of the respiratory system complicating pregnancy, third trimester: Secondary | ICD-10-CM | POA: Diagnosis not present

## 2020-03-09 DIAGNOSIS — J45901 Unspecified asthma with (acute) exacerbation: Secondary | ICD-10-CM | POA: Diagnosis not present

## 2020-03-09 DIAGNOSIS — O26893 Other specified pregnancy related conditions, third trimester: Secondary | ICD-10-CM | POA: Diagnosis present

## 2020-03-09 DIAGNOSIS — Z3A29 29 weeks gestation of pregnancy: Secondary | ICD-10-CM | POA: Diagnosis not present

## 2020-03-09 DIAGNOSIS — R070 Pain in throat: Secondary | ICD-10-CM | POA: Diagnosis not present

## 2020-03-09 DIAGNOSIS — M791 Myalgia, unspecified site: Secondary | ICD-10-CM | POA: Insufficient documentation

## 2020-03-09 DIAGNOSIS — Z5321 Procedure and treatment not carried out due to patient leaving prior to being seen by health care provider: Secondary | ICD-10-CM | POA: Diagnosis not present

## 2020-03-09 DIAGNOSIS — Z3A30 30 weeks gestation of pregnancy: Secondary | ICD-10-CM | POA: Diagnosis not present

## 2020-03-09 NOTE — ED Triage Notes (Signed)
Patient reports body aches, cough, sore throat since yesterday. States she is [redacted] weeks pregnant. Denies vaginal bleeding, loss of fluid.

## 2020-03-09 NOTE — Progress Notes (Signed)
I received a call from Malva Cogan, EMS supervisor for [facility] advising that his crew is treating a patient who is approximately [redacted] weeks pregnant with a chief complaint of SOB on [address].  Selena Batten advised that the pt told his crew that we would not see her because she had small children with her and that "they told me they would call CPS on me."  I asked Selena Batten to instruct his crew to bring the pt to our ambulance bay and I would meet them there. I made an inquiry regarding the pt to our security officer stationed at the visitor management desk.  The officer remembered the pt and did report that she had 2 small children with her.  The officer reports that she did explain that if possible, the pt should find someone to look after her children, which is our protocol.  The officer did note that the pt had on a white blood pressure cuff.  The officer reported that the pt tried to make phone calls and left the building without further inquiry.  Selena Batten called back a few minutes later and reported that they treated the patient and that she refused any further care.  He also reported that the pt was at [facility] ED and decided to leave because of the wait.  I found the pt still on the ED board for [facility].  It appears that the staff have made 3 attempts at this time to call the patient to the room.   The following is the triage note from [facility]:  Patient reports body aches, cough, sore throat since yesterday. States she is [redacted] weeks pregnant. Denies vaginal bleeding, loss of fluid.    Conley Simmonds BSN, Higher education careers adviser   [facility] [employer] [phone] [email or web address]

## 2020-03-10 ENCOUNTER — Inpatient Hospital Stay (HOSPITAL_COMMUNITY)
Admission: AD | Admit: 2020-03-10 | Discharge: 2020-03-10 | Disposition: A | Discharge status: Home / Self Care | Payer: Medicaid Other | Attending: Obstetrics & Gynecology | Admitting: Obstetrics & Gynecology

## 2020-03-10 ENCOUNTER — Other Ambulatory Visit: Payer: Self-pay

## 2020-03-10 ENCOUNTER — Encounter (HOSPITAL_COMMUNITY): Payer: Self-pay | Admitting: Obstetrics & Gynecology

## 2020-03-10 DIAGNOSIS — O99519 Diseases of the respiratory system complicating pregnancy, unspecified trimester: Secondary | ICD-10-CM

## 2020-03-10 DIAGNOSIS — Z87891 Personal history of nicotine dependence: Secondary | ICD-10-CM | POA: Insufficient documentation

## 2020-03-10 DIAGNOSIS — Z7982 Long term (current) use of aspirin: Secondary | ICD-10-CM | POA: Insufficient documentation

## 2020-03-10 DIAGNOSIS — Z3A30 30 weeks gestation of pregnancy: Secondary | ICD-10-CM | POA: Diagnosis not present

## 2020-03-10 DIAGNOSIS — O99513 Diseases of the respiratory system complicating pregnancy, third trimester: Secondary | ICD-10-CM | POA: Diagnosis present

## 2020-03-10 DIAGNOSIS — J45901 Unspecified asthma with (acute) exacerbation: Secondary | ICD-10-CM

## 2020-03-10 DIAGNOSIS — J45909 Unspecified asthma, uncomplicated: Secondary | ICD-10-CM

## 2020-03-10 MED ORDER — PREDNISONE 20 MG PO TABS: 60.0000 mg | ORAL_TABLET | Freq: Every day | ORAL | 0 refills | Status: AC

## 2020-03-10 MED ORDER — BUDESONIDE 90 MCG/ACT IN AEPB: 2.0000 | INHALATION_SPRAY | Freq: Two times a day (BID) | RESPIRATORY_TRACT | 3 refills | Status: DC

## 2020-03-10 MED ORDER — ALBUTEROL SULFATE HFA 108 (90 BASE) MCG/ACT IN AERS
2.0000 | INHALATION_SPRAY | Freq: Once | RESPIRATORY_TRACT | Status: AC
  Administered 2020-03-10: 2 via RESPIRATORY_TRACT
  Filled 2020-03-10: qty 6.7

## 2020-03-10 MED ORDER — METHYLPREDNISOLONE SODIUM SUCC 125 MG IJ SOLR
125.0000 mg | Freq: Once | INTRAMUSCULAR | Status: AC
  Administered 2020-03-10: 125 mg via INTRAMUSCULAR
  Filled 2020-03-10: qty 2

## 2020-03-10 MED ORDER — ALBUTEROL SULFATE HFA 108 (90 BASE) MCG/ACT IN AERS: 2.0000 | INHALATION_SPRAY | RESPIRATORY_TRACT | 3 refills | Status: DC | PRN

## 2020-03-10 MED ORDER — PREDNISONE 50 MG PO TABS
60.0000 mg | ORAL_TABLET | Freq: Every day | ORAL | Status: DC
  Filled 2020-03-10: qty 1

## 2020-03-10 NOTE — MAU Provider Note (Signed)
History     CSN: [id number]  Arrival date and time: 03/10/20 0030   None     No chief complaint on file.  HPI  [name] is a 25 y.o. female [phone] @ 10w0dhere in MAU with complaints of an asthma attack. She reports worsening asthma symptoms all day. She reports not having an inhaler at home because she had no refills left. She reports having poorly controlled asthma for a while. She is not using anything daily for her asthma. She reports daily use of albuterol leading up to her attack tonight.  + fetal movement, no pregnancy concerns.   OB History    Gravida  5   Para  3   Term  2   Preterm  1   AB  1   Living  3     SAB  1   IAB      Ectopic      Multiple      Live Births  3           Past Medical History:  Diagnosis Date  . Arthritis   . Asthma    used inhaler 1 wk ago  . Chlamydia infection   . History of chicken pox   . Preterm delivery     Past Surgical History:  Procedure Laterality Date  . CESAREAN SECTION    . NO PAST SURGERIES      Family History  Problem Relation Age of Onset  . Migraines Mother   . Mitral valve prolapse Mother   . Anemia Maternal Aunt   . Mitral valve prolapse Maternal Aunt   . Heart disease Paternal Uncle   . Mitral valve prolapse Maternal Grandmother   . Dementia Maternal Grandmother   . Kidney disease Paternal Grandmother        dialysis  . Cancer Paternal Grandfather        Lung    Social History   Tobacco Use  . Smoking status: Former Smoker    Packs/day: 0.25    Types: Cigarettes  . Smokeless tobacco: Never Used  Vaping Use  . Vaping Use: Never used  Substance Use Topics  . Alcohol use: Not Currently    Comment: occ  . Drug use: No    Allergies:  Allergies  Allergen Reactions  . Pollen Extract Swelling    Medications Prior to Admission  Medication Sig Dispense Refill Last Dose  . albuterol (VENTOLIN HFA) 108 (90 Base) MCG/ACT inhaler Inhale 2 puffs into the lungs every 4  (four) hours as needed for wheezing or shortness of breath. 18 g 0   . aspirin 81 MG chewable tablet Chew 1 tablet (81 mg total) by mouth daily. (Patient not taking: Reported on 02/15/2020) 30 tablet 5   . Blood Pressure Monitoring (BLOOD PRESSURE KIT) DEVI 1 Device by Does not apply route as needed. 1 each 0   . HYDROXYprogesterone caproate (MAKENA) autoinjector Inject 275 mg into the skin every 7 (seven) days. 1.05 mL 14   . metroNIDAZOLE (FLAGYL) 500 MG tablet Take 1 tablet (500 mg total) by mouth 2 (two) times daily. 14 tablet 0   . Prenatal Vit-Fe Phos-FA-Omega (VITAFOL GUMMIES) 3.33-0.333-34.8 MG CHEW Chew 3 tablets by mouth daily. 90 tablet 11    Recent Results (from the past 2160 hour(s))  GC[facility]Chlamydia probe amp [facility]    Status: None   Collection Time: 03/05/20 12:00 AM  Result Value Ref Range   Chlamydia Negative  Neisseria Gonorrhea Negative    Comment Normal Reference Ranger Chlamydia - Negative    Comment      Normal Reference Range Neisseria Gonorrhea - Negative  Urinalysis, Routine w reflex microscopic Urine, Clean Catch     Status: Abnormal   Collection Time: 03/05/20 12:43 AM  Result Value Ref Range   Color, Urine YELLOW YELLOW   APPearance HAZY (A) CLEAR   Specific Gravity, Urine 1.011 1.005 - 1.030   pH 6.0 5.0 - 8.0   Glucose, UA NEGATIVE NEGATIVE mg/dL   Hgb urine dipstick NEGATIVE NEGATIVE   Bilirubin Urine NEGATIVE NEGATIVE   Ketones, ur NEGATIVE NEGATIVE mg/dL   Protein, ur NEGATIVE NEGATIVE mg/dL   Nitrite NEGATIVE NEGATIVE   Leukocytes,Ua LARGE (A) NEGATIVE   RBC / HPF 0-5 0 - 5 RBC/hpf   WBC, UA 11-20 0 - 5 WBC/hpf   Bacteria, UA RARE (A) NONE SEEN   Squamous Epithelial / LPF 21-50 0 - 5    Comment: Performed at [facility], [address] [place], [place] [address]  Wet prep, genital     Status: Abnormal   Collection Time: 03/05/20  1:03 AM   Specimen: Vaginal  Result Value Ref Range   Yeast Wet Prep HPF POC NONE SEEN NONE  SEEN   Trich, Wet Prep NONE SEEN NONE SEEN   Clue Cells Wet Prep HPF POC PRESENT (A) NONE SEEN   WBC, Wet Prep HPF POC MANY (A) NONE SEEN   Sperm NONE SEEN     Comment: Performed at [facility], [place] [address] [place], [place] [address]  Maryann Alar Test     Status: None   Collection Time: 03/05/20  1:35 AM  Result Value Ref Range   POCT Fern Test Negative = intact amniotic membranes    Review of Systems  Constitutional: Negative for fatigue.  Respiratory: Positive for chest tightness, shortness of breath and wheezing.   Gastrointestinal: Negative for abdominal pain.   Physical Exam   Blood pressure 134/76, pulse 86, temperature 98.4 F (36.9 C), temperature source Oral, resp. rate 18, height _0  (1.6 m), weight 61.2 kg, last menstrual period 08/13/2019, SpO2 98 %, unknown if currently breastfeeding.  Physical Exam Vitals reviewed.  Constitutional:      General: She is in acute distress.  HENT:     Head: Normocephalic.  Pulmonary:     Effort: Tachypnea present.     Breath sounds: Decreased air movement present. Wheezing present.  Skin:    General: Skin is warm.  Neurological:     Mental Status: She is oriented to person, place, and time.    Fetal Tracing: Baseline: 135 bpm Variability: Moderate  Accelerations: 15x15 Decelerations: None Toco: None  MAU Course  Procedures  MDM  Pulse ox 100% on RA. Albuterol inhaler given; unable to do nebulizer d/t Covid.  Solumedrol 125 mg IM Patient significantly better, wheezing gone, pulse ox 100%, patient laying flat In bed resting.   Assessment and Plan   Problem List Items Addressed This Visit   None   Visit Diagnoses    Moderate asthma with exacerbation, unspecified whether persistent       Relevant Medications   albuterol (VENTOLIN HFA) 108 (90 Base) MCG/ACT inhaler   Budesonide 90 MCG/ACT inhaler   predniSONE (DELTASONE) 20 MG tablet   Other Relevant Orders   Discharge patient (Completed)   [redacted] weeks  gestation of pregnancy       Relevant Orders   Discharge patient (  Completed)   Asthma affecting pregnancy, antepartum       Relevant Medications   albuterol (VENTOLIN HFA) 108 (90 Base) MCG/ACT inhaler   Budesonide 90 MCG/ACT inhaler   predniSONE (DELTASONE) 20 MG tablet   Other Relevant Orders   Discharge patient (Completed)      Keep office visit on Thursday Return to MAU if symptoms worsen   Khaleed Holan, Artist Pais, NP 03/12/2020 8:42 AM

## 2020-03-10 NOTE — Discharge Instructions (Signed)
http://www.aaaai.org/conditions-and-treatments/asthma">  Asthma, Adult  Asthma is a long-term (chronic) condition that causes recurrent episodes in which the airways become tight and narrow. The airways are the passages that lead from the nose and mouth down into the lungs. Asthma episodes, also called asthma attacks, can cause coughing, wheezing, shortness of breath, and chest pain. The airways can also fill with mucus. During an attack, it can be difficult to breathe. Asthma attacks can range from minor to life threatening. Asthma cannot be cured, but medicines and lifestyle changes can help control it and treat acute attacks. What are the causes? This condition is believed to be caused by inherited (genetic) and environmental factors, but its exact cause is not known. There are many things that can bring on an asthma attack or make asthma symptoms worse (triggers). Asthma triggers are different for each person. Common triggers include:  Mold.  Dust.  Cigarette smoke.  Cockroaches.  Things that can cause allergy symptoms (allergens), such as animal dander or pollen from trees or grass.  Air pollutants such as household cleaners, wood smoke, smog, or chemical odors.  Cold air, weather changes, and winds (which increase molds and pollen in the air).  Strong emotional expressions such as crying or laughing hard.  Stress.  Certain medicines (such as aspirin) or types of medicines (such as beta-blockers).  Sulfites in foods and drinks. Foods and drinks that may contain sulfites include dried fruit, potato chips, and sparkling grape juice.  Infections or inflammatory conditions such as the flu, a cold, or inflammation of the nasal membranes (rhinitis).  Gastroesophageal reflux disease (GERD).  Exercise or strenuous activity. What are the signs or symptoms? Symptoms of this condition may occur right after asthma is triggered or many hours later. Symptoms include:  Wheezing. This can  sound like whistling when you breathe.  Excessive nighttime or early morning coughing.  Frequent or severe coughing with a common cold.  Chest tightness.  Shortness of breath.  Tiredness (fatigue) with minimal activity. How is this diagnosed? This condition is diagnosed based on:  Your medical history.  A physical exam.  Tests, which may include: ? Lung function studies and pulmonary studies (spirometry). These tests can evaluate the flow of air in your lungs. ? Allergy tests. ? Imaging tests, such as X-rays. How is this treated? There is no cure for this condition, but treatment can help control your symptoms. Treatment for asthma usually involves:  Identifying and avoiding your asthma triggers.  Using medicines to control your symptoms. Generally, two types of medicines are used to treat asthma: ? Controller medicines. These help prevent asthma symptoms from occurring. They are usually taken every day. ? Fast-acting reliever or rescue medicines. These quickly relieve asthma symptoms by widening the narrow and tight airways. They are used as needed and provide short-term relief.  Using supplemental oxygen. This may be needed during a severe episode.  Using other medicines, such as: ? Allergy medicines, such as antihistamines, if your asthma attacks are triggered by allergens. ? Immune medicines (immunomodulators). These are medicines that help control the immune system.  Creating an asthma action plan. An asthma action plan is a written plan for managing and treating your asthma attacks. This plan includes: ? A list of your asthma triggers and how to avoid them. ? Information about when medicines should be taken and when their dosage should be changed. ? Instructions about using a device called a peak flow meter. A peak flow meter measures how well the lungs are working   and the severity of your asthma. It helps you monitor your condition. Follow these instructions at  home: Controlling your home environment Control your home environment in the following ways to help avoid triggers and prevent asthma attacks:  Change your heating and air conditioning filter regularly.  Limit your use of fireplaces and wood stoves.  Get rid of pests (such as roaches and mice) and their droppings.  Throw away plants if you see mold on them.  Clean floors and dust surfaces regularly. Use unscented cleaning products.  Try to have someone else vacuum for you regularly. Stay out of rooms while they are being vacuumed and for a short while afterward. If you vacuum, use a dust mask from a hardware store, a double-layered or microfilter vacuum cleaner bag, or a vacuum cleaner with a HEPA filter.  Replace carpet with wood, tile, or vinyl flooring. Carpet can trap dander and dust.  Use allergy-proof pillows, mattress covers, and box spring covers.  Keep your bedroom a trigger-free room.  Avoid pets and keep windows closed when allergens are in the air.  Wash beddings every week in hot water and dry them in a dryer.  Use blankets that are made of polyester or cotton.  Clean bathrooms and kitchens with bleach. If possible, have someone repaint the walls in these rooms with mold-resistant paint. Stay out of the rooms that are being cleaned and painted.  Wash your hands often with soap and water. If soap and water are not available, use hand sanitizer.  Do not allow anyone to smoke in your home. General instructions  Take over-the-counter and prescription medicines only as told by your health care provider. ? Speak with your health care provider if you have questions about how or when to take the medicines. ? Make note if you are requiring more frequent dosages.  Do not use any products that contain nicotine or tobacco, such as cigarettes and e-cigarettes. If you need help quitting, ask your health care provider. Also, avoid being exposed to secondhand smoke.  Use a peak  flow meter as told by your health care provider. Record and keep track of the readings.  Understand and use the asthma action plan to help minimize, or stop an asthma attack, without needing to seek medical care.  Make sure you stay up to date on your yearly vaccinations as told by your health care provider. This may include vaccines for the flu and pneumonia.  Avoid outdoor activities when allergen counts are high and when air quality is low.  Wear a ski mask that covers your nose and mouth during outdoor winter activities. Exercise indoors on cold days if you can.  Warm up before exercising, and take time for a cool-down period after exercise.  Keep all follow-up visits as told by your health care provider. This is important. Where to find more information  For information about asthma, turn to the Centers for Disease Control and Prevention at www.cdc.gov/asthma/faqs  For air quality information, turn to AirNow at airnow.gov Contact a health care provider if:  You have wheezing, shortness of breath, or a cough even while you are taking medicine to prevent attacks.  The mucus you cough up (sputum) is thicker than usual.  Your sputum changes from clear or white to yellow, green, gray, or bloody.  Your medicines are causing side effects, such as a rash, itching, swelling, or trouble breathing.  You need to use a reliever medicine more than 2-3 times a week.  Your peak   flow reading is still at 50-79% of your personal best after following your action plan for 1 hour.  You have a fever. Get help right away if:  You are getting worse and do not respond to treatment during an asthma attack.  You are short of breath when at rest or when doing very little physical activity.  You have difficulty eating, drinking, or talking.  You have chest pain or tightness.  You develop a fast heartbeat or palpitations.  You have a bluish color to your lips or fingernails.  You are  light-headed or dizzy, or you faint.  Your peak flow reading is less than 50% of your personal best.  You feel too tired to breathe normally. Summary  Asthma is a long-term (chronic) condition that causes recurrent episodes in which the airways become tight and narrow. These episodes can cause coughing, wheezing, shortness of breath, and chest pain.  Asthma cannot be cured, but medicines and lifestyle changes can help control it and treat acute attacks.  Make sure you understand how to avoid triggers and how and when to use your medicines.  Asthma attacks can range from minor to life threatening. Get help right away if you have an asthma attack and do not respond to treatment with your usual rescue medicines. This information is not intended to replace advice given to you by your health care provider. Make sure you discuss any questions you have with your health care provider. Document Revised: 10/05/2019 Document Reviewed: 05/09/2019 Elsevier Patient Education  2021 [employer] Inc[employer]  

## 2020-03-11 ENCOUNTER — Encounter: Payer: Medicaid Other | Admitting: Advanced Practice Midwife

## 2020-03-13 ENCOUNTER — Other Ambulatory Visit: Payer: Medicaid Other

## 2020-03-13 ENCOUNTER — Encounter: Payer: Medicaid Other | Admitting: Obstetrics and Gynecology

## 2020-03-14 ENCOUNTER — Other Ambulatory Visit: Payer: Medicaid Other

## 2020-03-14 ENCOUNTER — Ambulatory Visit: Payer: Medicaid Other

## 2020-04-04 ENCOUNTER — Encounter: Payer: Medicaid Other | Admitting: Obstetrics & Gynecology

## 2020-04-04 ENCOUNTER — Other Ambulatory Visit: Payer: Medicaid Other

## 2020-04-09 ENCOUNTER — Other Ambulatory Visit: Payer: Self-pay

## 2020-04-09 ENCOUNTER — Inpatient Hospital Stay (HOSPITAL_COMMUNITY)
Admission: AD | Admit: 2020-04-09 | Discharge: 2020-04-09 | Disposition: A | Discharge status: Home / Self Care | Payer: Medicaid Other | Attending: Obstetrics & Gynecology | Admitting: Obstetrics & Gynecology

## 2020-04-09 ENCOUNTER — Encounter (HOSPITAL_COMMUNITY): Payer: Self-pay | Admitting: Obstetrics & Gynecology

## 2020-04-09 DIAGNOSIS — Z0371 Encounter for suspected problem with amniotic cavity and membrane ruled out: Secondary | ICD-10-CM | POA: Diagnosis not present

## 2020-04-09 DIAGNOSIS — Z3A34 34 weeks gestation of pregnancy: Secondary | ICD-10-CM

## 2020-04-09 LAB — WET PREP, GENITAL
Clue Cells Wet Prep HPF POC: NONE SEEN
Sperm: NONE SEEN
Trich, Wet Prep: NONE SEEN
Yeast Wet Prep HPF POC: NONE SEEN

## 2020-04-09 NOTE — MAU Note (Signed)
...  Tammy [name] is a 25 y.o. at [redacted]w[redacted]d here in MAU reporting: she began leaking "white fluids" around an hour ago. She reports she went to take a shower and when she got out of the shower she noticed more leaking of the white fluids and states "It smells like a swimming pool." +FM. No VB. No CTX, just vaginal pressure that is not new. Last intercourse one week ago.  Patient currently not wearing any underwear or pads.  Pain score: No pain FHT: 145

## 2020-04-09 NOTE — MAU Provider Note (Signed)
Event Date/Time  First Provider Initiated Contact with Patient 04/09/20 1837   S: Ms. Tammy [name] is a 25 y.o. [id number] at [redacted]w[redacted]d  who presents to MAU today complaining of leaking of white fluid since this afternoon while in the shower. She denies vaginal bleeding. She denies contractions. She reports normal fetal movement.  No recent intercourse.   O: BP 130/60 (BP Location: Right Arm)   Pulse 95   Temp 98 F (36.7 C) (Oral)   Resp 17   Ht 5\' 3"  (1.6 m)   Wt 63 kg   LMP 08/13/2019 (Exact Date)   SpO2 100%   BMI 24.62 kg/m  GENERAL: Well-developed, well-nourished female in no acute distress.  HEAD: Normocephalic, atraumatic.  CHEST: Normal effort of breathing, regular heart rate ABDOMEN: Soft, nontender, gravid PELVIC: Normal external female genitalia. Vagina is pink and rugated. Cervix with normal contour, no lesions. Normal discharge.  negative pooling.   Cervical exam: deferred   Fetal Monitoring: Baseline: 125 bpm  Variability: Moderate  Accelerations: 15x15 Decelerations: None Contractions: occasional   Results for orders placed or performed during the hospital encounter of 04/09/20 (from the past 24 hour(s))  Wet prep, genital     Status: Abnormal   Collection Time: 04/09/20  6:39 PM  Result Value Ref Range   Yeast Wet Prep HPF POC NONE SEEN NONE SEEN   Trich, Wet Prep NONE SEEN NONE SEEN   Clue Cells Wet Prep HPF POC NONE SEEN NONE SEEN   WBC, Wet Prep HPF POC MANY (A) NONE SEEN   Sperm NONE SEEN      A: SIUP at [redacted]w[redacted]d  Membranes intact  FERN negative   P:  DC home Labor precautions Return to MAU if symptoms worsen   [redacted]w[redacted]d I, NP 04/09/2020 8:09 PM

## 2020-04-15 ENCOUNTER — Other Ambulatory Visit: Payer: Self-pay

## 2020-04-15 ENCOUNTER — Other Ambulatory Visit (HOSPITAL_COMMUNITY)
Admission: RE | Admit: 2020-04-15 | Discharge: 2020-04-15 | Disposition: A | Discharge status: Home / Self Care | Payer: Medicaid Other | Source: Ambulatory Visit | Attending: Obstetrics & Gynecology | Admitting: Obstetrics & Gynecology

## 2020-04-15 ENCOUNTER — Ambulatory Visit (INDEPENDENT_AMBULATORY_CARE_PROVIDER_SITE_OTHER): Payer: Medicaid Other | Admitting: Obstetrics & Gynecology

## 2020-04-15 ENCOUNTER — Other Ambulatory Visit: Payer: Medicaid Other

## 2020-04-15 ENCOUNTER — Inpatient Hospital Stay (HOSPITAL_COMMUNITY)
Admission: AD | Admit: 2020-04-15 | Discharge: 2020-04-15 | Disposition: A | Discharge status: Home / Self Care | Payer: Medicaid Other | Attending: Obstetrics & Gynecology | Admitting: Obstetrics & Gynecology

## 2020-04-15 ENCOUNTER — Encounter (HOSPITAL_COMMUNITY): Payer: Self-pay | Admitting: Obstetrics & Gynecology

## 2020-04-15 VITALS — BP 146/74 | HR 86 | Wt 140.0 lb

## 2020-04-15 DIAGNOSIS — O99513 Diseases of the respiratory system complicating pregnancy, third trimester: Secondary | ICD-10-CM

## 2020-04-15 DIAGNOSIS — O26893 Other specified pregnancy related conditions, third trimester: Secondary | ICD-10-CM | POA: Diagnosis not present

## 2020-04-15 DIAGNOSIS — O0930 Supervision of pregnancy with insufficient antenatal care, unspecified trimester: Secondary | ICD-10-CM | POA: Insufficient documentation

## 2020-04-15 DIAGNOSIS — O479 False labor, unspecified: Secondary | ICD-10-CM

## 2020-04-15 DIAGNOSIS — O09213 Supervision of pregnancy with history of pre-term labor, third trimester: Secondary | ICD-10-CM

## 2020-04-15 DIAGNOSIS — J45909 Unspecified asthma, uncomplicated: Secondary | ICD-10-CM

## 2020-04-15 DIAGNOSIS — O34219 Maternal care for unspecified type scar from previous cesarean delivery: Secondary | ICD-10-CM

## 2020-04-15 DIAGNOSIS — O4703 False labor before 37 completed weeks of gestation, third trimester: Secondary | ICD-10-CM | POA: Diagnosis not present

## 2020-04-15 DIAGNOSIS — R030 Elevated blood-pressure reading, without diagnosis of hypertension: Secondary | ICD-10-CM | POA: Diagnosis not present

## 2020-04-15 DIAGNOSIS — Z87891 Personal history of nicotine dependence: Secondary | ICD-10-CM | POA: Insufficient documentation

## 2020-04-15 DIAGNOSIS — O09899 Supervision of other high risk pregnancies, unspecified trimester: Secondary | ICD-10-CM

## 2020-04-15 DIAGNOSIS — Z3A35 35 weeks gestation of pregnancy: Secondary | ICD-10-CM | POA: Diagnosis not present

## 2020-04-15 LAB — URINALYSIS, ROUTINE W REFLEX MICROSCOPIC
Bilirubin Urine: NEGATIVE
Glucose, UA: 150 mg/dL — AB
Hgb urine dipstick: NEGATIVE
Ketones, ur: NEGATIVE mg/dL
Leukocytes,Ua: NEGATIVE
Nitrite: NEGATIVE
Protein, ur: NEGATIVE mg/dL
Specific Gravity, Urine: 1.011 (ref 1.005–1.030)
pH: 7 (ref 5.0–8.0)

## 2020-04-15 MED ORDER — TERBUTALINE SULFATE 1 MG/ML IJ SOLN
0.2500 mg | Freq: Once | INTRAMUSCULAR | Status: AC
  Administered 2020-04-15: 0.25 mg via SUBCUTANEOUS
  Filled 2020-04-15: qty 1

## 2020-04-15 NOTE — Discharge Instructions (Signed)
Rosen's Emergency Medicine: Concepts and Clinical Practice (9th ed., pp. 2296- 2312). Elsevier.">  Braxton Hicks Contractions Contractions of the uterus can occur throughout pregnancy, but they are not always a sign that you are in labor. You may have practice contractions called Braxton Hicks contractions. These false labor contractions are sometimes confused with true labor. What are Braxton Hicks contractions? Braxton Hicks contractions are tightening movements that occur in the muscles of the uterus before labor. Unlike true labor contractions, these contractions do not result in opening (dilation) and thinning of the cervix. Toward the end of pregnancy (32-34 weeks), Braxton Hicks contractions can happen more often and may become stronger. These contractions are sometimes difficult to tell apart from true labor because they can be very uncomfortable. You should not feel embarrassed if you go to the hospital with false labor. Sometimes, the only way to tell if you are in true labor is for your health care provider to look for changes in the cervix. The health care provider will do a physical exam and may monitor your contractions. If you are not in true labor, the exam should show that your cervix is not dilating and your water has not broken. If there are no other health problems associated with your pregnancy, it is completely safe for you to be sent home with false labor. You may continue to have Braxton Hicks contractions until you go into true labor. How to tell the difference between true labor and false labor True labor  Contractions last 30-70 seconds.  Contractions become very regular.  Discomfort is usually felt in the top of the uterus, and it spreads to the lower abdomen and low back.  Contractions do not go away with walking.  Contractions usually become more intense and increase in frequency.  The cervix dilates and gets thinner. False labor  Contractions are usually shorter  and not as strong as true labor contractions.  Contractions are usually irregular.  Contractions are often felt in the front of the lower abdomen and in the groin.  Contractions may go away when you walk around or change positions while lying down.  Contractions get weaker and are shorter-lasting as time goes on.  The cervix usually does not dilate or become thin. Follow these instructions at home:  Take over-the-counter and prescription medicines only as told by your health care provider.  Keep up with your usual exercises and follow other instructions from your health care provider.  Eat and drink lightly if you think you are going into labor.  If Braxton Hicks contractions are making you uncomfortable: ? Change your position from lying down or resting to walking, or change from walking to resting. ? Sit and rest in a tub of warm water. ? Drink enough fluid to keep your urine pale yellow. Dehydration may cause these contractions. ? Do slow and deep breathing several times an hour.  Keep all follow-up prenatal visits as told by your health care provider. This is important.   Contact a health care provider if:  You have a fever.  You have continuous pain in your abdomen. Get help right away if:  Your contractions become stronger, more regular, and closer together.  You have fluid leaking or gushing from your vagina.  You pass blood-tinged mucus (bloody show).  You have bleeding from your vagina.  You have low back pain that you never had before.  You feel your baby's head pushing down and causing pelvic pressure.  Your baby is not moving inside   you as much as it used to. Summary  Contractions that occur before labor are called Braxton Hicks contractions, false labor, or practice contractions.  Braxton Hicks contractions are usually shorter, weaker, farther apart, and less regular than true labor contractions. True labor contractions usually become progressively  stronger and regular, and they become more frequent.  Manage discomfort from Braxton Hicks contractions by changing position, resting in a warm bath, drinking plenty of water, or practicing deep breathing. This information is not intended to replace advice given to you by your health care provider. Make sure you discuss any questions you have with your health care provider. Document Revised: 12/17/2016 Document Reviewed: 05/20/2016 Elsevier Patient Education  2021 Elsevier Inc.   Fetal Movement Counts Patient Name: ________________________________________________ Patient Due Date: ____________________  What is a fetal movement count? A fetal movement count is the number of times that you feel your baby move during a certain amount of time. This may also be called a fetal kick count. A fetal movement count is recommended for every pregnant woman. You may be asked to start counting fetal movements as early as week 28 of your pregnancy. Pay attention to when your baby is most active. You may notice your baby's sleep and wake cycles. You may also notice things that make your baby move more. You should do a fetal movement count:  When your baby is normally most active.  At the same time each day. A good time to count movements is while you are resting, after having something to eat and drink. How do I count fetal movements? 1. Find a quiet, comfortable area. Sit, or lie down on your side. 2. Write down the date, the start time and stop time, and the number of movements that you felt between those two times. Take this information with you to your health care visits. 3. Write down your start time when you feel the first movement. 4. Count kicks, flutters, swishes, rolls, and jabs. You should feel at least 10 movements. 5. You may stop counting after you have felt 10 movements, or if you have been counting for 2 hours. Write down the stop time. 6. If you do not feel 10 movements in 2 hours, contact  your health care provider for further instructions. Your health care provider may want to do additional tests to assess your baby's well-being. Contact a health care provider if:  You feel fewer than 10 movements in 2 hours.  Your baby is not moving like he or she usually does. Date: ____________ Start time: ____________ Stop time: ____________ Movements: ____________ Date: ____________ Start time: ____________ Stop time: ____________ Movements: ____________ Date: ____________ Start time: ____________ Stop time: ____________ Movements: ____________ Date: ____________ Start time: ____________ Stop time: ____________ Movements: ____________ Date: ____________ Start time: ____________ Stop time: ____________ Movements: ____________ Date: ____________ Start time: ____________ Stop time: ____________ Movements: ____________ Date: ____________ Start time: ____________ Stop time: ____________ Movements: ____________ Date: ____________ Start time: ____________ Stop time: ____________ Movements: ____________ Date: ____________ Start time: ____________ Stop time: ____________ Movements: ____________ This information is not intended to replace advice given to you by your health care provider. Make sure you discuss any questions you have with your health care provider. Document Revised: 08/24/2018 Document Reviewed: 08/24/2018 Elsevier Patient Education  2021 Elsevier Inc.  

## 2020-04-15 NOTE — MAU Note (Signed)
...  Tammy [name] is a 25 y.o. at [redacted]w[redacted]d here in MAU reporting: CTX since 0300 this morning. She states they are "at least 5 minutes" apart. She had a scheduled OB visit with Dr. Debroah Loop at [phone]. Patient states her cervix was 2cm. Sent from [facility] office per Dr. Debroah Loop. +FM. No LOF. She reports her discharge is thin and white. Last IC two weeks ago.  BP: 134/85 P: 85 T: 98.1 R: 19 02: 100%  Pain score: 9/10 lower abdominal - intermittent FHT: 146 doppler Lab orders placed from triage:  UA

## 2020-04-15 NOTE — Progress Notes (Signed)
+   Fetal movement. Pt states she has been having contractions every 5 minutes since 5 am this morning.

## 2020-04-15 NOTE — MAU Provider Note (Addendum)
History     CSN: [id number]  Arrival date and time: 04/15/20 8657   Event Date/Time   First Provider Initiated Contact with Patient 04/15/20 [phone]      Chief Complaint  Patient presents with   Contractions   HPI Tammy [name] is a 25 y.o. [id number] at 19w1dwho presents from the office for contractions. [facility]. Reports contractions every 5 minutes since 3 am this morning. Cervix checked in office by Dr. ARoselie Awkward& was told she was 2 cm. Sent here for PTL evaluation. Denies n/v/d, dysuria, vaginal bleeding, or LOF. Reports good fetal movement. Rates pain 9/10. Hasn't treated symptoms.   OB History     Gravida  5   Para  3   Term  2   Preterm  1   AB  1   Living  3      SAB  1   IAB      Ectopic      Multiple      Live Births  3           Past Medical History:  Diagnosis Date   Arthritis    Asthma    used inhaler 1 wk ago   Chlamydia infection    History of chicken pox    Preterm delivery     Past Surgical History:  Procedure Laterality Date   CESAREAN SECTION      Family History  Problem Relation Age of Onset   Migraines Mother    Mitral valve prolapse Mother    Anemia Maternal Aunt    Mitral valve prolapse Maternal Aunt    Heart disease Paternal Uncle    Mitral valve prolapse Maternal Grandmother    Dementia Maternal Grandmother    Kidney disease Paternal Grandmother        dialysis   Cancer Paternal Grandfather        Lung    Social History   Tobacco Use   Smoking status: Former Smoker    Packs/day: 0.25    Types: Cigarettes   Smokeless tobacco: Never Used  VScientific laboratory technicianUse: Never used  Substance Use Topics   Alcohol use: Not Currently    Comment: occ   Drug use: No    Allergies:  Allergies  Allergen Reactions   Pollen Extract Swelling    Medications Prior to Admission  Medication Sig Dispense Refill Last Dose   albuterol (VENTOLIN HFA) 108 (90 Base) MCG/ACT  inhaler Inhale 2 puffs into the lungs every 4 (four) hours as needed for wheezing or shortness of breath. 18 g 3    aspirin 81 MG chewable tablet Chew 1 tablet (81 mg total) by mouth daily. 30 tablet 5    Blood Pressure Monitoring (BLOOD PRESSURE KIT) DEVI 1 Device by Does not apply route as needed. 1 each 0    Budesonide 90 MCG/ACT inhaler Inhale 2 puffs into the lungs 2 (two) times daily. 1 each 3    HYDROXYprogesterone caproate (MAKENA) autoinjector Inject 275 mg into the skin every 7 (seven) days. 1.05 mL 14    Prenatal Vit-Fe Phos-FA-Omega (VITAFOL GUMMIES) 3.33-0.333-34.8 MG CHEW Chew 3 tablets by mouth daily. 90 tablet 11     Review of Systems  Constitutional: Negative.   Gastrointestinal: Positive for abdominal pain. Negative for nausea and vomiting.  Genitourinary: Negative.    Physical Exam   Blood pressure 134/85, pulse 85, resp. rate 19,  last menstrual period 08/13/2019, unknown if currently breastfeeding.  Physical Exam Vitals and nursing note reviewed. Exam conducted with a chaperone present.  Constitutional:      General: She is not in acute distress.    Appearance: Normal appearance.  HENT:     Head: Normocephalic and atraumatic.  Abdominal:     Comments: Gravid uterus Ctx palpate mild  Genitourinary:    Comments: Dilation: 1.5 Effacement (%): Thick Cervical Position: Posterior Station: Ballotable Exam by:: Jorje Guild NP  Skin:    General: Skin is warm and dry.  Neurological:     Mental Status: She is alert.  Psychiatric:        Mood and Affect: Mood normal.        Behavior: Behavior normal.    NST:  Baseline: 145 bpm, Variability: Good {> 6 bpm), Accelerations: Reactive, Decelerations: Absent and ctx Q4-6 minutes  MAU Course  Procedures Results for orders placed or performed during the hospital encounter of 04/15/20 (from the past 24 hour(s))  Urinalysis, Routine w reflex microscopic Urine, Clean Catch     Status: Abnormal   Collection Time:  04/15/20  9:28 AM  Result Value Ref Range   Color, Urine STRAW (A) YELLOW   APPearance CLEAR CLEAR   Specific Gravity, Urine 1.011 1.005 - 1.030   pH 7.0 5.0 - 8.0   Glucose, UA 150 (A) NEGATIVE mg/dL   Hgb urine dipstick NEGATIVE NEGATIVE   Bilirubin Urine NEGATIVE NEGATIVE   Ketones, ur NEGATIVE NEGATIVE mg/dL   Protein, ur NEGATIVE NEGATIVE mg/dL   Nitrite NEGATIVE NEGATIVE   Leukocytes,Ua NEGATIVE NEGATIVE    MDM Reactive fetal tracing. Ctx every 4-6 minutes that palpate mild. Cervix 1.5/thick/posterior/ballotable. Given PO fluids. Cervix rechecked after 1+ hour of monitoring & is unchanged. Given terb for patient comfort. TOCO quiet & patient sleeping in room. Will discharge home with PTL precautions.   Patient did not complete her gtt this morning since she was sent to [facility]. Message sent to [facility] to reschedule GTT  Assessment and Plan   1. Braxton Hicks contractions   2. [redacted] weeks gestation of pregnancy    -reviewed reasons to return to MAU -msg to [facility] to reschedule appointment  Jorje Guild 04/15/2020, 10:11 AM   Attestation of Attending Supervision of Advanced Practice Provider (PA/CNM/NP): Evaluation and management procedures were performed by the Advanced Practice Provider under my supervision and collaboration.  I have reviewed the Advanced Practice Provider's note and chart, and I agree with the management and plan. I have also made any necessary editorial changes.   Annalee Genta, DO Attending [place], [facility] for [facility], [facility] 04/16/2020 4:06 PM

## 2020-04-15 NOTE — Patient Instructions (Signed)
Preterm Labor Pregnancy normally lasts 39-41 weeks. Preterm labor is when labor starts before you have been pregnant for 37 weeks. Babies who are born too early may have problems with blood sugar, body temperature, heart, and breathing. These problems may be very serious in babies who are born before 34 weeks of pregnancy. What are the causes? The cause of this condition is not known. What increases the risk? You are more likely to have preterm labor if:  You have medical problems, now or in the past.  You have problems now or in your past pregnancies.  You have lifestyle problems. Medical history  You have problems of the womb (uterus).  You have an infection, including infections you get from sex.  You have problems that do not go away, such as: ? Blood clots. ? High blood pressure. ? High blood sugar.  You have low body weight or too much body weight. Present and past pregnancies  You have had preterm labor before.  You are pregnant with two babies or more.  You have a condition in which the placenta covers your cervix.  You waited less than 6 months between giving birth and becoming pregnant again.  Your unborn baby has some problems.  You have bleeding from your vagina.  You became pregnant by a method called IVF. Lifestyle  You smoke.  You drink alcohol.  You use drugs.  You have stress.  You have abuse in your home.  You come in contact with chemicals that harm the body (pollutants). Other factors  You are younger than 17 years or older than 35 years. What are the signs or symptoms? Symptoms of this condition include:  Cramps. The cramps may feel like cramps from a period.  You may have watery poop (diarrhea).  Pain in the belly (abdomen).  Pain in the lower back.  Regular contractions. It may feel like your belly is getting tighter.  Pressure in the lower belly.  More fluid leaking from the vagina. The fluid may be watery or  bloody.  Water breaking. How is this treated? Treatment for this condition depends on your health, the health of your baby, and how old your pregnancy is. It may include:  Taking medicines, such as: ? Hormone medicines. ? Medicines to stop contractions. ? Medicines to help mature the baby's lungs. ? Medicines to prevent your baby from getting cerebral palsy.  Bed rest. If the labor happens before 34 weeks of pregnancy, you may need to stay in the hospital.  Delivering the baby. Follow these instructions at home:  Do not use any products that contain nicotine or tobacco, such as cigarettes, e-cigarettes, and chewing tobacco. If you need help quitting, ask your doctor.  Do not drink alcohol.  Take over-the-counter and prescription medicines only as told by your doctor.  Rest as told by your doctor.  Return to your activities as told by your doctor. Ask your doctor what activities are safe for you.  Keep all follow-up visits as told by your doctor. This is important.   How is this prevented? To have a healthy pregnancy:  Do not use street drugs.  Do not use any medicines unless you ask your doctor if they are safe for you.  Talk with your doctor before taking any herbal supplements.  Make sure you gain enough weight.  Watch for infection. If you think you might have an infection, get it checked right away. Symptoms of infection may include: ? Fever. ? Vaginal discharge. ?   Pain or burning when you pee. ? Needing to pee urgently. ? Needing to pee often. ? Peeing small amounts often. ? Blood in your pee. ? Pee that smells bad or unusual.  Tell your doctor if you have gone into preterm labor before. Contact a doctor if:  You think you are going into preterm labor.  You have symptoms of preterm labor.  You have symptoms of infection. Get help right away if:  You are having painful contractions every 5 minutes or less.  Your water breaks. Summary  Preterm labor  is labor that starts before you reach 37 weeks of pregnancy.  Your baby may have problems if delivered early.  The cause of preterm labor is not known. Having problems of the womb (uterus), an infection, or bleeding during pregnancy increases the risk.  Contact a doctor if you have signs or symptoms of preterm labor. This information is not intended to replace advice given to you by your health care provider. Make sure you discuss any questions you have with your health care provider. Document Revised: 02/06/2019 Document Reviewed: 02/06/2019 Elsevier Patient Education  2021 Elsevier Inc.  

## 2020-04-15 NOTE — MAU Provider Note (Signed)
History   [id number] Arrival date and time: 04/15/20 7425   Chief Complaint  Patient presents with  . Contractions    HPI Tammy [name] is a 25 y.o. at 58w1dby LMP = 12 wk u[facility]s [facility] PMHx significant for asthma. Patient reports contractions since 3am. They are occurring every 529ms. +FM. Denies LOF or vaginal bleeding but endorses white vaginal discharge. Denies headache, vision changes, RUQ pain or peripheral edema.   [facility]  -late transfer of PNC (25 wks) from [facility] -hx of preterm delivery '@[redacted]w[redacted]d'  (G1), has received Makena x1 -hx of IUGR in prior pregnancy -s/p c/s x1 (desires TOLAC) Per [facility] records: prenatal course complicated by chlamydia in 1st trimester (TOC negative), current smoker, THC+ on UDS     OB History    Gravida  5   Para  3   Term  2   Preterm  1   AB  1   Living  3     SAB  1   IAB      Ectopic      Multiple      Live Births  3           Past Medical History:  Diagnosis Date  . Arthritis   . Asthma    used inhaler 1 wk ago  . Chlamydia infection   . History of chicken pox   . Preterm delivery     Past Surgical History:  Procedure Laterality Date  . CESAREAN SECTION      Family History  Problem Relation Age of Onset  . Migraines Mother   . Mitral valve prolapse Mother   . Anemia Maternal Aunt   . Mitral valve prolapse Maternal Aunt   . Heart disease Paternal Uncle   . Mitral valve prolapse Maternal Grandmother   . Dementia Maternal Grandmother   . Kidney disease Paternal Grandmother        dialysis  . Cancer Paternal Grandfather        Lung    Social History   Socioeconomic History  . Marital status: Single    Spouse name: Not on file  . Number of children: Not on file  . Years of education: Not on file  . Highest education level: Not on file  Occupational History  . Not on file  Tobacco Use  . Smoking status:  Former Smoker    Packs/day: 0.25    Types: Cigarettes  . Smokeless tobacco: Never Used  Vaping Use  . Vaping Use: Never used  Substance and Sexual Activity  . Alcohol use: Not Currently    Comment: occ  . Drug use: No  . Sexual activity: Yes    Birth control/protection: None  Other Topics Concern  . Not on file  Social History Narrative   ** Merged History Encounter **       Social Determinants of Health   Financial Resource Strain: Not on file  Food Insecurity: Not on file  Transportation Needs: Not on file  Physical Activity: Not on file  Stress: Not on file  Social Connections: Not on file  Intimate Partner Violence: Not on file    Allergies  Allergen Reactions  . Pollen Extract Swelling    No current facility-administered medications on file prior to encounter.   Current Outpatient Medications on File Prior to Encounter  Medication Sig Dispense Refill  . albuterol (VENTOLIN HFA) 108 (90  Base) MCG/ACT inhaler Inhale 2 puffs into the lungs every 4 (four) hours as needed for wheezing or shortness of breath. 18 g 3  . aspirin 81 MG chewable tablet Chew 1 tablet (81 mg total) by mouth daily. 30 tablet 5  . Blood Pressure Monitoring (BLOOD PRESSURE KIT) DEVI 1 Device by Does not apply route as needed. 1 each 0  . Budesonide 90 MCG/ACT inhaler Inhale 2 puffs into the lungs 2 (two) times daily. 1 each 3  . HYDROXYprogesterone caproate (MAKENA) autoinjector Inject 275 mg into the skin every 7 (seven) days. 1.05 mL 14  . Prenatal Vit-Fe Phos-FA-Omega (VITAFOL GUMMIES) 3.33-0.333-34.8 MG CHEW Chew 3 tablets by mouth daily. 90 tablet 11    ROS Pertinent positives and negative per HPI, all others reviewed and negative  Physical Exam   BP 134/76   Pulse 89   Resp 19   LMP 08/13/2019 (Exact Date)   Physical Exam Gen: alert, well-appearing, NAD Abd: gravid, nontender  Cervical Exam Dilation: 1.5 Effacement (%): Thick Cervical Position: Posterior Station:  Ballotable Exam by:: Jorje Guild NP  FHT Baseline 130, moderate variability, + accels, no decels Toco: q3-5 mins Cat: I   Labs Results for orders placed or performed during the hospital encounter of 04/15/20 (from the past 24 hour(s))  Urinalysis, Routine w reflex microscopic Urine, Clean Catch     Status: Abnormal   Collection Time: 04/15/20  9:28 AM  Result Value Ref Range   Color, Urine STRAW (A) YELLOW   APPearance CLEAR CLEAR   Specific Gravity, Urine 1.011 1.005 - 1.030   pH 7.0 5.0 - 8.0   Glucose, UA 150 (A) NEGATIVE mg/dL   Hgb urine dipstick NEGATIVE NEGATIVE   Bilirubin Urine NEGATIVE NEGATIVE   Ketones, ur NEGATIVE NEGATIVE mg/dL   Protein, ur NEGATIVE NEGATIVE mg/dL   Nitrite NEGATIVE NEGATIVE   Leukocytes,Ua NEGATIVE NEGATIVE    Imaging No results found.  MAU Course  Procedures  Lab Orders     Urinalysis, Routine w reflex microscopic Meds ordered this encounter  Medications  . terbutaline (BRETHINE) injection 0.25 mg   Imaging Orders  No imaging studies ordered today    Assessment and Plan  Tammy [name] is a 25 y.o. [id number] at [facility].  #False Labor Cervical exam 1.5/thick/ballotable-- unchanged on repeat exam after >1 hr.  Still with CTX q5 mins despite PO hydration-- given Terb x1 for patient comfort. Discharge home Labor precautions and kick counts included in AVS Patient to follow-up with [facility] as scheduled  Patient may return to MAU as needed or when in labor   #FWB FHT Cat I strip  #Elevated BP 146/74 in the office this morning. 134/85 on arrival to MAU 1 hr later. Asymptomatic. Monitor BP at routine outpatient OB f/u   AAlcus Dad

## 2020-04-15 NOTE — Progress Notes (Signed)
   PRENATAL VISIT NOTE  Subjective:  Tammy [name] is a 25 y.o. [phone] at [redacted]w[redacted]d being seen today for ongoing prenatal care.  She is currently monitored for the following issues for this high-risk pregnancy and has Asthma; Asthma, chronic; Asthma, severe persistent, poorly-controlled; History of preterm delivery, currently pregnant; History of prior pregnancy with IUGR newborn; and History of inadequate prenatal care on their problem list.  Patient reports contractions since 0300.  Contractions: Regular. Vag. Bleeding: None.  Movement: Present. Denies leaking of fluid.   The following portions of the patient's history were reviewed and updated as appropriate: allergies, current medications, past family history, past medical history, past social history, past surgical history and problem list.   Objective:   Vitals:   04/15/20 0831  BP: (!) 146/74  Pulse: 86  Weight: 140 lb (63.5 kg)    Fetal Status: Fetal Heart Rate (bpm): 128   Movement: Present     General:  Alert, oriented and cooperative. Patient is in no acute distress.  Skin: Skin is warm and dry. No rash noted.   Cardiovascular: Normal heart rate noted  Respiratory: Normal respiratory effort, no problems with respiration noted  Abdomen: Soft, gravid, appropriate for gestational age.  Pain/Pressure: Present     Pelvic: Cervical exam performed in the presence of a chaperone        Extremities: Normal range of motion.  Edema: Trace  Mental Status: Normal mood and affect. Normal behavior. Normal judgment and thought content.   Assessment and Plan:  Pregnancy: [id number] at 107w1d 1. History of preterm delivery, currently pregnant Tests performed - Strep Gp B NAA - Cervicovaginal ancillary only( [place])  2. History of inadequate prenatal care R/o PTL, to MAU  Preterm labor symptoms and general obstetric precautions including but not limited to vaginal bleeding, contractions, leaking of fluid and fetal movement were  reviewed in detail with the patient. Please refer to After Visit Summary for other counseling recommendations.   Return in about 1 week (around 04/22/2020).  Future Appointments  Date Time Provider Department Center  04/15/2020 10:30 AM Adam Phenix, MD [facility] None  05/01/2020  3:00 PM Myrlene Broker, MD [facility] None    Scheryl Darter, MD

## 2020-04-16 LAB — CERVICOVAGINAL ANCILLARY ONLY
Chlamydia: NEGATIVE
Comment: NEGATIVE
Comment: NEGATIVE
Comment: NORMAL
Neisseria Gonorrhea: NEGATIVE
Trichomonas: NEGATIVE

## 2020-04-17 LAB — STREP GP B NAA: Strep Gp B NAA: POSITIVE — AB

## 2020-04-21 ENCOUNTER — Other Ambulatory Visit: Payer: Medicaid Other

## 2020-04-21 ENCOUNTER — Encounter: Payer: Medicaid Other | Admitting: Advanced Practice Midwife

## 2020-04-22 ENCOUNTER — Encounter: Payer: Medicaid Other | Admitting: Advanced Practice Midwife

## 2020-04-22 ENCOUNTER — Other Ambulatory Visit: Payer: Self-pay

## 2020-04-22 ENCOUNTER — Ambulatory Visit (INDEPENDENT_AMBULATORY_CARE_PROVIDER_SITE_OTHER): Payer: Medicaid Other | Admitting: Advanced Practice Midwife

## 2020-04-22 VITALS — BP 126/69 | HR 76 | Wt 141.0 lb

## 2020-04-22 DIAGNOSIS — Z3A36 36 weeks gestation of pregnancy: Secondary | ICD-10-CM

## 2020-04-22 DIAGNOSIS — O0990 Supervision of high risk pregnancy, unspecified, unspecified trimester: Secondary | ICD-10-CM

## 2020-04-22 DIAGNOSIS — B951 Streptococcus, group B, as the cause of diseases classified elsewhere: Secondary | ICD-10-CM

## 2020-04-22 NOTE — Patient Instructions (Signed)

## 2020-04-22 NOTE — Progress Notes (Signed)
+   Fetal movement. No complaints.  

## 2020-04-22 NOTE — Progress Notes (Signed)
   PRENATAL VISIT NOTE  Subjective:  Tammy [name] is a 25 y.o. [phone] at [redacted]w[redacted]d being seen today for ongoing prenatal care.  She is currently monitored for the following issues for this high-risk pregnancy and has Asthma; Asthma, chronic; Asthma, severe persistent, poorly-controlled; History of preterm delivery, currently pregnant; History of prior pregnancy with IUGR newborn; and History of inadequate prenatal care on their problem list.  Patient reports no complaints.  Contractions: Irregular. Vag. Bleeding: None.  Movement: Present. Denies leaking of fluid.   The following portions of the patient's history were reviewed and updated as appropriate: allergies, current medications, past family history, past medical history, past social history, past surgical history and problem list. Problem list updated.  Objective:   Vitals:   04/22/20 1429  BP: 126/69  Pulse: 76  Weight: 141 lb (64 kg)    Fetal Status: Fetal Heart Rate (bpm): 128 Fundal Height: 34 cm Movement: Present  Presentation: Vertex  General:  Alert, oriented and cooperative. Patient is in no acute distress.  Skin: Skin is warm and dry. No rash noted.   Cardiovascular: Normal heart rate noted  Respiratory: Normal respiratory effort, no problems with respiration noted  Abdomen: Soft, gravid, appropriate for gestational age.  Pain/Pressure: Present     Pelvic: Cervical exam performed Dilation: 2 Effacement (%): Thick Station: Ballotable  Extremities: Normal range of motion.  Edema: Trace  Mental Status: Normal mood and affect. Normal behavior. Normal judgment and thought content.   Assessment and Plan:  Pregnancy: [id number] at [redacted]w[redacted]d  1. Supervision of high risk pregnancy, antepartum - Cervix unchanged from previous visit  2. [redacted] weeks gestation of pregnancy   3. Positive GBS test - PCN in labor  Preterm labor symptoms and general obstetric precautions including but not limited to vaginal bleeding, contractions,  leaking of fluid and fetal movement were reviewed in detail with the patient. Please refer to After Visit Summary for other counseling recommendations.  Return in about 1 week (around 04/29/2020).  Future Appointments  Date Time Provider Department Center  04/29/2020 11:15 AM Currie Paris, NP [facility] None  05/01/2020  3:00 PM Myrlene Broker, MD [facility] None    Calvert Cantor, [place]

## 2020-04-28 ENCOUNTER — Encounter: Payer: Self-pay | Admitting: Nurse Practitioner

## 2020-04-29 ENCOUNTER — Other Ambulatory Visit: Payer: Self-pay

## 2020-04-29 ENCOUNTER — Ambulatory Visit (INDEPENDENT_AMBULATORY_CARE_PROVIDER_SITE_OTHER): Payer: Medicaid Other | Admitting: Nurse Practitioner

## 2020-04-29 ENCOUNTER — Encounter: Payer: Self-pay | Admitting: Nurse Practitioner

## 2020-04-29 VITALS — BP 127/75 | HR 82 | Wt 138.0 lb

## 2020-04-29 DIAGNOSIS — O99820 Streptococcus B carrier state complicating pregnancy: Secondary | ICD-10-CM | POA: Insufficient documentation

## 2020-04-29 DIAGNOSIS — O0990 Supervision of high risk pregnancy, unspecified, unspecified trimester: Secondary | ICD-10-CM

## 2020-04-29 DIAGNOSIS — Z3A37 37 weeks gestation of pregnancy: Secondary | ICD-10-CM

## 2020-04-29 DIAGNOSIS — J45909 Unspecified asthma, uncomplicated: Secondary | ICD-10-CM

## 2020-04-29 DIAGNOSIS — O402XX0 Polyhydramnios, second trimester, not applicable or unspecified: Secondary | ICD-10-CM

## 2020-04-29 DIAGNOSIS — O0930 Supervision of pregnancy with insufficient antenatal care, unspecified trimester: Secondary | ICD-10-CM

## 2020-04-29 NOTE — Progress Notes (Signed)
    Subjective:  Tammy [name] is a 25 y.o. [phone] at [redacted]w[redacted]d being seen today for ongoing prenatal care.  She is currently monitored for the following issues for this high-risk pregnancy and has Asthma; Asthma, chronic; Asthma, severe persistent, poorly-controlled; History of preterm delivery, currently pregnant; History of prior pregnancy with IUGR newborn; History of inadequate prenatal care; History of cesarean delivery, currently pregnant; and Group B Streptococcus carrier, +RV culture, currently pregnant on their problem list.  Patient reports occasional contractions.  Contractions: Irritability. Vag. Bleeding: None.  Movement: Present. Denies leaking of fluid.   The following portions of the patient's history were reviewed and updated as appropriate: allergies, current medications, past family history, past medical history, past social history, past surgical history and problem list. Problem list updated.  Objective:   Vitals:   04/29/20 1107  BP: 127/75  Pulse: 82  Weight: 138 lb (62.6 kg)    Fetal Status: Fetal Heart Rate (bpm): 137 Fundal Height: 35 cm Movement: Present  Presentation: Vertex  General:  Alert, oriented and cooperative. Patient is in no acute distress.  Skin: Skin is warm and dry. No rash noted.   Cardiovascular: Normal heart rate noted  Respiratory: Normal respiratory effort, no problems with respiration noted  Abdomen: Soft, gravid, appropriate for gestational age. Pain/Pressure: Present     Pelvic:  Cervical exam performed Dilation: 2 Effacement (%): Thick Station: Ballotable  Extremities: Normal range of motion.  Edema: Trace  Mental Status: Normal mood and affect. Normal behavior. Normal judgment and thought content.   Urinalysis:      Assessment and Plan:  Pregnancy: [id number] at [redacted]w[redacted]d  1. Supervision of high risk pregnancy, antepartum Doing well Wants cervical exam - no change from previous exam last week Unsure about contraception - info about IUD  in AVS.  Does not want Nexplanon.  2. History of inadequate prenatal care Transfer in from [facility] Did not keep [place] appointments as scheduled here HX of IUGR in previous pregnancy  3. Chronic asthma without complication, unspecified asthma severity, unspecified whether persistent Uses inhaler once or twice a day to avoid wheezing Stopped smoking prior to pregnancy  4. Group B Streptococcus carrier, +RV culture, currently pregnant Reviewed with patient Info in AVS  5. Polyhydramnios in second trimester, single or unspecified fetus Scheduled but did not go to [place] appointments for reevaluation Did not realize this until after she left appointment Schedule for reeval and dopplers??  Term labor symptoms and general obstetric precautions including but not limited to vaginal bleeding, contractions, leaking of fluid and fetal movement were reviewed in detail with the patient. Please refer to After Visit Summary for other counseling recommendations.  Return in about 1 week (around 05/06/2020) for in person ROB.  Nolene Bernheim, RN, MSN, NP-BC Nurse Practitioner, [facility] for [employer], [facility] 04/29/2020 11:42 AM

## 2020-04-29 NOTE — Progress Notes (Signed)
ROB [redacted]w[redacted]d  GBS +  CT/GC Negative on 04/15/20  CC: Pressure , wants cervix check today.

## 2020-04-29 NOTE — Patient Instructions (Addendum)
Group B Streptococcus Infection During Pregnancy Group B Streptococcus (GBS) is a type of bacteria that is often found in healthy people. It is commonly found in the rectum, vagina, and intestines. In people who are healthy and not pregnant, the bacteria rarely cause serious illness or complications. However, women who test positive for GBS during pregnancy can pass the bacteria to the baby during childbirth. This can cause serious infection in the baby after birth. Women with GBS may also have infections during their pregnancy or soon after childbirth. The infections include urinary tract infections (UTIs) or infections of the uterus. GBS also increases a woman's risk of complications during pregnancy, such as early labor or delivery, miscarriage, or stillbirth. Routine testing for GBS is recommended for all pregnant women. What are the causes? This condition is caused by bacteria called Streptococcus agalactiae. What increases the risk? You may have a higher risk for GBS infection during pregnancy if you had one during a past pregnancy. What are the signs or symptoms? In most cases, GBS infection does not cause symptoms in pregnant women. If symptoms exist, they may include:  Labor that starts before the 37th week of pregnancy.  A UTI or bladder infection. This may cause a fever, frequent urination, or pain and burning during urination.  Fever during labor. There can also be a rapid heartbeat in the mother or baby. Rare but serious symptoms of a GBS infection in women include:  Blood infection (septicemia). This may cause fever, chills, or confusion.  Lung infection (pneumonia). This may cause fever, chills, cough, rapid breathing, chest pain, or difficulty breathing.  Bone, joint, skin, or soft tissue infection. How is this diagnosed? You may be screened for GBS between week 35 and week 37 of pregnancy. If you have symptoms of preterm labor, you may be screened earlier. This condition is  diagnosed based on lab test results from:  A swab of fluid from the vagina and rectum.  A urine sample. How is this treated? This condition is treated with antibiotic medicine. Antibiotic medicine may be given:  To you when you go into labor, or as soon as your water breaks. The medicines will continue until after you give birth. If you are having a cesarean delivery, you do not need antibiotics unless your water has broken.  To your baby, if he or she requires treatment. Your health care provider will check your baby to decide if he or she needs antibiotics to prevent a serious infection.   Follow these instructions at home:  Take over-the-counter and prescription medicines only as told by your health care provider.  Take your antibiotic medicine as told by your health care provider. Do not stop taking the antibiotic even if you start to feel better.  Keep all pre-birth (prenatal) visits and follow-up visits as told by your health care provider. This is important. Contact a health care provider if:  You have pain or burning when you urinate.  You have to urinate more often than usual.  You have a fever or chills.  You develop a bad-smelling vaginal discharge. Get help right away if:  Your water breaks.  You go into labor.  You have severe pain in your abdomen.  You have difficulty breathing.  You have chest pain. These symptoms may represent a serious problem that is an emergency. Do not wait to see if the symptoms will go away. Get medical help right away. Call your local emergency services (911 in the [place] Do not drive   yourself to the hospital. Summary  GBS is a type of bacteria that is common in healthy people.  During pregnancy, colonization with GBS can cause serious complications for you or your baby.  Your health care provider will screen you between 35 and 37 weeks of pregnancy to determine if you are colonized with GBS.  If you are colonized with GBS during  pregnancy, your health care provider will recommend antibiotics through an IV during labor.  After delivery, your baby will be evaluated for complications related to potential GBS infection and may require antibiotics to prevent a serious infection. This information is not intended to replace advice given to you by your health care provider. Make sure you discuss any questions you have with your health care provider. Document Revised: 11/06/2019 Document Reviewed: 07/31/2018 Elsevier Patient Education  2021 Elsevier Inc.     Intrauterine Device Information An intrauterine device (IUD) is a medical device that is inserted into the uterus to prevent pregnancy. It is a small, T-shaped device that has one or two nylon strings hanging down from it. The strings hang out of the lower part of the uterus (cervix) to allow for future IUD removal. There are two types of IUDs:  Hormone IUD. This type of IUD is made of plastic and contains the hormone progestin (synthetic progesterone). A hormone IUD may last 3-5 years.  Copper IUD. This type of IUD has copper wire wrapped around it. A copper IUD may last up to 10 years. How is an IUD inserted? An IUD is inserted through the vagina, through the cervix, and into the uterus with a minor medical procedure. The procedure for IUD insertion may vary among health care providers and hospitals. How does an IUD work? Synthetic progesterone in a hormonal IUD prevents pregnancy by:  Thickening cervical mucus to prevent sperm from entering the uterus.  Thinning the uterine lining to prevent a fertilized egg from being implanted there. Copper in a copper IUD prevents pregnancy by making the uterus and fallopian tubes produce a fluid that kills sperm. What are the advantages of an IUD? Advantages of either type of IUD An IUD:  Is highly effective in preventing pregnancy.  Is reversible. You can become pregnant shortly after the IUD is removed.  Is  low-maintenance and can stay in place for a long time.  Has no estrogen-related side effects.  Can be used when breastfeeding.  Is not associated with weight gain.  Can be inserted right after childbirth, an abortion, or a miscarriage. Advantages of a hormone IUD  If it is inserted within 7 days of your period starting, it works right after it has been inserted. If the hormone IUD is inserted at any other time in your cycle, you will need to use a backup method of birth control for 7 days after insertion.  It can make menstrual periods lighter or stop completely.  It can reduce menstrual cramping and other discomforts from menstrual periods.  It can be used for 3-5 years, depending on which IUD you have. Advantages of a copper IUD  It works right after it is inserted.  It can be used as a form of emergency birth control if it is inserted within 5 days after having unprotected sex.  It does not interfere with your body's natural hormones.  It can be used for up to 10 years. What are the disadvantages of an IUD?  An IUD may cause irregular menstrual bleeding for a period of time after insertion.  It is common to have pain during insertion and have cramping and vaginal bleeding after insertion.  An IUD may cut the uterus (uterine perforation) when it is inserted. This is rare.  Pelvic inflammatory disease (PID) may happen after insertion of an IUD. PID is an infection in the uterus and fallopian tubes. The IUD does not cause the infection. The infection is usually from an unknown sexually transmitted infection (STI). This is rare, and it usually happens during the first 20 days after the IUD is inserted.  A copper IUD can make your menstrual flow heavier and more painful.  IUDs cannot prevent sexually transmitted infections (STIs). How is an IUD removed?   You will lie on your back with your knees bent and your feet in footrests (stirrups).  A device will be inserted into  your vagina to spread apart the vaginal walls (speculum). This will allow your health care provider to see the strings attached to the IUD.  Your health care provider will use a small instrument (forceps) to grasp the IUD strings and will pull firmly until the IUD is removed. You may have some discomfort when the IUD is removed. Your health care provider may recommend taking over-the-counter pain relievers, such as ibuprofen, before the procedure. You may also have minor spotting for a few days after the procedure. The procedure for IUD removal may vary among health care providers and hospitals. Is an IUD right for me? If you are interested in an IUD, discuss it with your health care provider. He or she will make sure you are a good candidate for an IUD and will let you know more about the advantages, disadvantage, and possible side effects. This will allow you to make a decision about the device. Summary  An intrauterine device (IUD) is a medical device that is inserted in the uterus to prevent pregnancy. It is a small, T-shaped device that has one or two nylon strings hanging down from it.  A hormone IUD contains the hormone progestin (synthetic progesterone). A copper IUD has copper wire wrapped around it.  Synthetic progesterone in a hormone IUD prevents pregnancy by thickening cervical mucus and thinning the walls of the uterus. Copper in a copper IUD prevents pregnancy by making the uterus and fallopian tubes produce a fluid that kills sperm.  A hormone IUD can be left in place for 3-5 years. A copper IUD can be left in place for up to 10 years.  An IUD is inserted and removed by a health care provider. You may feel some pain during insertion and removal. Your health care provider may recommend taking over-the-counter pain medicine, such as ibuprofen, before an IUD procedure. This information is not intended to replace advice given to you by your health care provider. Make sure you discuss  any questions you have with your health care provider. Document Revised: 07/18/2019 Document Reviewed: 07/18/2019 Elsevier Patient Education  2021 [employer].

## 2020-05-01 ENCOUNTER — Ambulatory Visit: Payer: Medicaid Other | Admitting: Internal Medicine

## 2020-05-06 ENCOUNTER — Encounter: Payer: Medicaid Other | Admitting: Obstetrics and Gynecology

## 2020-05-12 ENCOUNTER — Other Ambulatory Visit: Payer: Self-pay

## 2020-05-12 ENCOUNTER — Inpatient Hospital Stay (HOSPITAL_BASED_OUTPATIENT_CLINIC_OR_DEPARTMENT_OTHER): Payer: Medicaid Other

## 2020-05-12 ENCOUNTER — Encounter: Payer: Self-pay | Admitting: Obstetrics

## 2020-05-12 ENCOUNTER — Inpatient Hospital Stay (EMERGENCY_DEPARTMENT_HOSPITAL)
Admission: AD | Admit: 2020-05-12 | Discharge: 2020-05-13 | Disposition: A | Discharge status: Home / Self Care | Payer: Medicaid Other | Source: Home / Self Care | Attending: Obstetrics and Gynecology | Admitting: Obstetrics and Gynecology

## 2020-05-12 ENCOUNTER — Ambulatory Visit (INDEPENDENT_AMBULATORY_CARE_PROVIDER_SITE_OTHER): Payer: Medicaid Other | Admitting: Obstetrics

## 2020-05-12 VITALS — BP 123/70 | HR 69 | Wt 137.0 lb

## 2020-05-12 DIAGNOSIS — O365930 Maternal care for other known or suspected poor fetal growth, third trimester, not applicable or unspecified: Secondary | ICD-10-CM | POA: Insufficient documentation

## 2020-05-12 DIAGNOSIS — O34219 Maternal care for unspecified type scar from previous cesarean delivery: Secondary | ICD-10-CM

## 2020-05-12 DIAGNOSIS — O365921 Maternal care for other known or suspected poor fetal growth, second trimester, fetus 1: Secondary | ICD-10-CM

## 2020-05-12 DIAGNOSIS — O09899 Supervision of other high risk pregnancies, unspecified trimester: Secondary | ICD-10-CM

## 2020-05-12 DIAGNOSIS — O365990 Maternal care for other known or suspected poor fetal growth, unspecified trimester, not applicable or unspecified: Secondary | ICD-10-CM

## 2020-05-12 DIAGNOSIS — O402XX0 Polyhydramnios, second trimester, not applicable or unspecified: Secondary | ICD-10-CM

## 2020-05-12 DIAGNOSIS — O0930 Supervision of pregnancy with insufficient antenatal care, unspecified trimester: Secondary | ICD-10-CM

## 2020-05-12 DIAGNOSIS — Z3A39 39 weeks gestation of pregnancy: Secondary | ICD-10-CM | POA: Insufficient documentation

## 2020-05-12 DIAGNOSIS — Z98891 History of uterine scar from previous surgery: Secondary | ICD-10-CM

## 2020-05-12 DIAGNOSIS — Z3689 Encounter for other specified antenatal screening: Secondary | ICD-10-CM

## 2020-05-12 DIAGNOSIS — Z87891 Personal history of nicotine dependence: Secondary | ICD-10-CM | POA: Insufficient documentation

## 2020-05-12 DIAGNOSIS — O471 False labor at or after 37 completed weeks of gestation: Secondary | ICD-10-CM

## 2020-05-12 DIAGNOSIS — O09299 Supervision of pregnancy with other poor reproductive or obstetric history, unspecified trimester: Secondary | ICD-10-CM

## 2020-05-12 DIAGNOSIS — O0990 Supervision of high risk pregnancy, unspecified, unspecified trimester: Secondary | ICD-10-CM

## 2020-05-12 NOTE — Progress Notes (Signed)
Subjective:  Tammy [name] is a 25 y.o. [phone] at [redacted]w[redacted]d being seen today for ongoing prenatal care.  She is currently monitored for the following issues for this high-risk pregnancy and has Asthma; Asthma, chronic; Asthma, severe persistent, poorly-controlled; History of preterm delivery, currently pregnant; History of prior pregnancy with IUGR newborn; History of inadequate prenatal care; History of cesarean delivery, currently pregnant; and Group B Streptococcus carrier, +RV culture, currently pregnant on their problem list.  Patient reports no complaints.  Contractions: Irregular. Vag. Bleeding: None.  Movement: Present. Denies leaking of fluid.   The following portions of the patient's history were reviewed and updated as appropriate: allergies, current medications, past family history, past medical history, past social history, past surgical history and problem list. Problem list updated.  Objective:   Vitals:   05/12/20 1639  BP: 123/70  Pulse: 69  Weight: 137 lb (62.1 kg)    Fetal Status:     Movement: Present     General:  Alert, oriented and cooperative. Patient is in no acute distress.  Skin: Skin is warm and dry. No rash noted.   Cardiovascular: Normal heart rate noted  Respiratory: Normal respiratory effort, no problems with respiration noted  Abdomen: Soft, gravid, appropriate for gestational age. Pain/Pressure: Present     Pelvic:  Cervical exam performed      1-2 cm / long / -3 / Vtx ballotable, confirmed on [place] exam  Extremities: Normal range of motion.  Edema: Trace  Mental Status: Normal mood and affect. Normal behavior. Normal judgment and thought content.   Urinalysis:      Assessment and Plan:  Pregnancy: [id number] at [redacted]w[redacted]d  1. Supervision of high risk pregnancy, antepartum  2. History of inadequate prenatal care - late transfer from [facility]  3. History of preterm delivery, currently pregnant  4. Prior pregnancy complicated by IUGR,  antepartum  5. Polyhydramnios in second trimester, single or unspecified fetus - no follow up  6. IUGR (intrauterine growth restriction) affecting care of mother, second trimester, fetus 1 - no follow up  7. Previous cesarean section  8. Patient desires vaginal birth after cesarean section (VBAC)   Preterm labor symptoms and general obstetric precautions including but not limited to vaginal bleeding, contractions, leaking of fluid and fetal movement were reviewed in detail with the patient. Please refer to After Visit Summary for other counseling recommendations.   Reviewed chart and patient's history fully, and I called her to express my concerned over her lack of recommended follow up and testing after her 2nd trimester ultrasound revealed polyhydramnios and IUGR.  She stated that she is having contractions and wants to go to the hospital to be checked.  She also stated that she thought that she wanted a TOLAC but now she would like a repeat C/S. The OB history should also be corrected to indicate that she has not had a VBAC.  She stated that her first 2 deliveries were vaginal, and her last delivery was by C/S because the baby did not tolerate labor -" the FHR kept dropping ".    Brock Bad, MD  05/12/20

## 2020-05-12 NOTE — Progress Notes (Signed)
Pt would like cervix check today.  

## 2020-05-12 NOTE — MAU Note (Signed)
Pt here c/o of abd pain and pressure. Received a call from Dr. Clearance Coots to come in for further evaluation due to IUGR. Pt endorses positive FM, declines leaking of fluid and vag bleeding

## 2020-05-12 NOTE — MAU Provider Note (Signed)
Chief Complaint:  Abdominal Pain   Event Date/Time   First Provider Initiated Contact with Patient 05/12/20 2334     HPI: Tammy [name] is a 25 y.o. [id number] at 29w1dwho presents to maternity admissions reporting irregular contractions and pelvic pressure. Sent to MAU by Dr. HJodi Mourningwho called in reporting concern over Ms. Huffine's lack of follow up for IUGR/polyhydramnios at 26wks and wants further evaluation as soon as possible. Pt has been seen multiple times since transferring care to [facility] but never went for MFM follow up. Has measured within 2wks of gestational age at each appointment. Denies vaginal bleeding, leaking of fluid, decreased fetal movement, fever, falls, or recent illness.   Pregnancy Course: [facility] but transferred to [facility] in January. No prenatal record on file from [facility] cannot see GTT but pt has stated in records that it was normal.  Past Medical History:  Diagnosis Date  . Arthritis   . Asthma    used inhaler 1 wk ago  . Chlamydia infection   . History of chicken pox   . Preterm delivery    OB History  Gravida Para Term Preterm AB Living  '5 3 2 1 1 3  ' SAB IAB Ectopic Multiple Live Births  1       3    # Outcome Date GA Lbr Len/2nd Weight Sex Delivery Anes PTL Lv  5 Current           4 Term 2019 439w0d7 lb (3.175 kg) F CS-Unspec   LIV     Birth Comments: wnl, c/s because FTP  3 Term 11/23/12 3979w1d:46 / 00:34 5 lb 10.5 oz (2.566 kg) M Vag-Spont EPI  LIV     Birth Comments: wnl  2 Preterm 01/11/11 32w34w6d00 / 00:31 4 lb 4.1 oz (1.93 kg) M Vag-Spont EPI  LIV     Birth Comments: No dysmorphic features  1 SAB            Past Surgical History:  Procedure Laterality Date  . CESAREAN SECTION     Family History  Problem Relation Age of Onset  . Migraines Mother   . Mitral valve prolapse Mother   . Anemia Maternal Aunt   . Mitral valve prolapse Maternal Aunt   . Heart disease Paternal Uncle   . Mitral valve prolapse  Maternal Grandmother   . Dementia Maternal Grandmother   . Kidney disease Paternal Grandmother        dialysis  . Cancer Paternal Grandfather        Lung   Social History   Tobacco Use  . Smoking status: Former Smoker    Packs/day: 0.25    Types: Cigarettes  . Smokeless tobacco: Never Used  Vaping Use  . Vaping Use: Never used  Substance Use Topics  . Alcohol use: Not Currently    Comment: occ  . Drug use: No   Allergies  Allergen Reactions  . Pollen Extract Swelling   No medications prior to admission.   I have reviewed patient's Past Medical Hx, Surgical Hx, Family Hx, Social Hx, medications and allergies.   ROS:  Review of Systems  Constitutional: Negative for fatigue and fever.  HENT: Negative for congestion and sore throat.   Eyes: Negative for visual disturbance.  Respiratory: Negative for cough and shortness of breath.   Cardiovascular: Negative for chest pain.  Gastrointestinal: Positive for abdominal pain (contractions). Negative for constipation, diarrhea, nausea and vomiting.  Genitourinary:  Positive for pelvic pain (pressure). Negative for vaginal bleeding and vaginal discharge.  Musculoskeletal: Negative for back pain.  Neurological: Negative for dizziness, syncope and headaches.  All other systems reviewed and are negative.  Physical Exam   Patient Vitals for the past 24 hrs:  BP Temp Temp src Pulse Resp Height Weight  05/13/20 0051 (!) 117/52 -- -- 64 -- -- --  05/13/20 0050 -- -- -- 66 -- -- --  05/12/20 2330 133/70 98.7 F (37.1 C) Oral 81 17 -- --  05/12/20 2323 -- -- -- 73 17 '5\' 3"'  (1.6 m) 142 lb (64.4 kg)   Constitutional: Well-developed, well-nourished female in no acute distress.  Cardiovascular: normal rate & rhythm, no murmur Respiratory: normal effort, lung sounds clear throughout GI: Abd soft, non-tender, gravid appropriate for gestational age. Pos BS x 4 MS: Extremities nontender, no edema, normal ROM Neurologic: Alert and oriented x  4.  GU: no CVA tenderness Pelvic: NEFG, cervix 2/30/ballotable  Fetal Tracing: reactive Baseline: 135 Variability: moderate Accelerations: 15x15 Decelerations: none Toco: relaxed   Labs: No results found for this or any previous visit (from the past 24 hour(s)).  Imaging:   Media Information  Document Information Ultrasound    05/13/2020 00:00  Attached To:  Hospital Encounter on 05/12/20   MAU Course: Orders Placed This Encounter  Procedures  . [place] MFM OB FOLLOW UP  . [place] MFM FETAL BPP WO NON STRESS  . [place] MFM UA CORD DOPPLER  . Discharge patient   No orders of the defined types were placed in this encounter.  MDM: Fundus measures 38wks, fetal movement palpable with vertex presentation. U/S ordered for estimated size, BPP and dopplers.   U/S gave preliminary report, growth is low at <3%ile, but BPP=8/8 with good dopplers. Discussed findings with Dr. Rip Harbour who recommended delivery (TOLAC consent signed at today's visit) by 40wks. Pt requests IOL as soon as possible, scheduled for 05/14/20 AM, IOL orders placed.  Assessment: 1. False labor after 37 completed weeks of gestation   2. IUGR (intrauterine growth restriction) affecting care of mother   3. NST (non-stress test) reactive    Plan: Discharge home in stable condition with term labor precautions.     Follow-up Information    [facility] Follow up on 05/14/2020.   Specialty: Obstetrics and Gynecology Why: Labor & Delivery will call you an hour before they are ready for you, starting at 5:45am. If they do not call in the morning, they will call in the afternoon. Please do not come in until they call. Contact information: [address] [id number] [place] [phone]              Allergies as of 05/13/2020      Reactions   Pollen Extract Swelling      Medication List    TAKE these medications   albuterol 108 (90 Base) MCG/ACT inhaler Commonly known  as: VENTOLIN HFA Inhale 2 puffs into the lungs every 4 (four) hours as needed for wheezing or shortness of breath.   aspirin 81 MG chewable tablet Chew 1 tablet (81 mg total) by mouth daily.   Blood Pressure Kit Devi 1 Device by Does not apply route as needed.   Budesonide 90 MCG/ACT inhaler Inhale 2 puffs into the lungs 2 (two) times daily.   HYDROXYprogesterone caproate autoinjector Commonly known as: Makena Inject 275 mg into the skin every 7 (seven) days.   Vitafol Gummies 3.33-0.333-34.8 MG Chew Chew  3 tablets by mouth daily.      Gaylan Gerold, CNM, MSN, [place] Certified Nurse Midwife, [facility]

## 2020-05-13 ENCOUNTER — Other Ambulatory Visit: Payer: Self-pay | Admitting: Certified Nurse Midwife

## 2020-05-13 ENCOUNTER — Other Ambulatory Visit: Payer: Self-pay | Admitting: Advanced Practice Midwife

## 2020-05-13 DIAGNOSIS — O09293 Supervision of pregnancy with other poor reproductive or obstetric history, third trimester: Secondary | ICD-10-CM

## 2020-05-13 DIAGNOSIS — O365930 Maternal care for other known or suspected poor fetal growth, third trimester, not applicable or unspecified: Secondary | ICD-10-CM | POA: Diagnosis not present

## 2020-05-13 DIAGNOSIS — O34219 Maternal care for unspecified type scar from previous cesarean delivery: Secondary | ICD-10-CM | POA: Diagnosis not present

## 2020-05-13 DIAGNOSIS — O09213 Supervision of pregnancy with history of pre-term labor, third trimester: Secondary | ICD-10-CM

## 2020-05-13 DIAGNOSIS — Z3A39 39 weeks gestation of pregnancy: Secondary | ICD-10-CM

## 2020-05-13 DIAGNOSIS — O471 False labor at or after 37 completed weeks of gestation: Secondary | ICD-10-CM

## 2020-05-13 DIAGNOSIS — J45909 Unspecified asthma, uncomplicated: Secondary | ICD-10-CM

## 2020-05-13 DIAGNOSIS — O403XX0 Polyhydramnios, third trimester, not applicable or unspecified: Secondary | ICD-10-CM | POA: Diagnosis not present

## 2020-05-13 DIAGNOSIS — O99513 Diseases of the respiratory system complicating pregnancy, third trimester: Secondary | ICD-10-CM

## 2020-05-13 NOTE — Progress Notes (Signed)
Orders for IOL

## 2020-05-13 NOTE — Discharge Instructions (Signed)
Fetal Movement Counts Patient Name: ________________________________________________ Patient Due Date: ____________________  What is a fetal movement count? A fetal movement count is the number of times that you feel your baby move during a certain amount of time. This may also be called a fetal kick count. A fetal movement count is recommended for every pregnant woman. You may be asked to start counting fetal movements as early as week 28 of your pregnancy. Pay attention to when your baby is most active. You may notice your baby's sleep and wake cycles. You may also notice things that make your baby move more. You should do a fetal movement count:  When your baby is normally most active.  At the same time each day. A good time to count movements is while you are resting, after having something to eat and drink. How do I count fetal movements? 1. Find a quiet, comfortable area. Sit, or lie down on your side. 2. Write down the date, the start time and stop time, and the number of movements that you felt between those two times. Take this information with you to your health care visits. 3. Write down your start time when you feel the first movement. 4. Count kicks, flutters, swishes, rolls, and jabs. You should feel at least 10 movements. 5. You may stop counting after you have felt 10 movements, or if you have been counting for 2 hours. Write down the stop time. 6. If you do not feel 10 movements in 2 hours, contact your health care provider for further instructions. Your health care provider may want to do additional tests to assess your baby's well-being. Contact a health care provider if:  You feel fewer than 10 movements in 2 hours.  Your baby is not moving like he or she usually does. Date: ____________ Start time: ____________ Stop time: ____________ Movements: ____________ Date: ____________ Start time: ____________ Stop time: ____________ Movements: ____________ Date: ____________  Start time: ____________ Stop time: ____________ Movements: ____________ Date: ____________ Start time: ____________ Stop time: ____________ Movements: ____________ Date: ____________ Start time: ____________ Stop time: ____________ Movements: ____________ Date: ____________ Start time: ____________ Stop time: ____________ Movements: ____________ Date: ____________ Start time: ____________ Stop time: ____________ Movements: ____________ Date: ____________ Start time: ____________ Stop time: ____________ Movements: ____________ Date: ____________ Start time: ____________ Stop time: ____________ Movements: ____________ This information is not intended to replace advice given to you by your health care provider. Make sure you discuss any questions you have with your health care provider. Document Revised: 08/24/2018 Document Reviewed: 08/24/2018 Elsevier Patient Education  2021 Elsevier Inc.  

## 2020-05-14 ENCOUNTER — Inpatient Hospital Stay (HOSPITAL_COMMUNITY)
Admission: AD | Admit: 2020-05-14 | Discharge: 2020-05-17 | DRG: 786 | Disposition: A | Discharge status: Home / Self Care | Payer: Medicaid Other | Attending: Obstetrics & Gynecology | Admitting: Obstetrics & Gynecology

## 2020-05-14 ENCOUNTER — Inpatient Hospital Stay (HOSPITAL_COMMUNITY): Payer: Medicaid Other | Admitting: Anesthesiology

## 2020-05-14 ENCOUNTER — Encounter (HOSPITAL_COMMUNITY): Payer: Self-pay | Admitting: Family Medicine

## 2020-05-14 ENCOUNTER — Encounter (HOSPITAL_COMMUNITY)
Admission: AD | Disposition: A | Discharge status: Home / Self Care | Payer: Self-pay | Source: Home / Self Care | Attending: Obstetrics & Gynecology

## 2020-05-14 ENCOUNTER — Inpatient Hospital Stay (HOSPITAL_COMMUNITY)
Admission: AD | Admit: 2020-05-14 | Payer: Medicaid Other | Source: Home / Self Care | Admitting: Obstetrics and Gynecology

## 2020-05-14 ENCOUNTER — Other Ambulatory Visit: Payer: Self-pay

## 2020-05-14 ENCOUNTER — Inpatient Hospital Stay (HOSPITAL_COMMUNITY): Payer: Medicaid Other

## 2020-05-14 DIAGNOSIS — Z8759 Personal history of other complications of pregnancy, childbirth and the puerperium: Secondary | ICD-10-CM

## 2020-05-14 DIAGNOSIS — O34211 Maternal care for low transverse scar from previous cesarean delivery: Secondary | ICD-10-CM | POA: Diagnosis present

## 2020-05-14 DIAGNOSIS — O9952 Diseases of the respiratory system complicating childbirth: Secondary | ICD-10-CM | POA: Diagnosis present

## 2020-05-14 DIAGNOSIS — Z20822 Contact with and (suspected) exposure to covid-19: Secondary | ICD-10-CM | POA: Diagnosis present

## 2020-05-14 DIAGNOSIS — O99324 Drug use complicating childbirth: Secondary | ICD-10-CM | POA: Diagnosis present

## 2020-05-14 DIAGNOSIS — D62 Acute posthemorrhagic anemia: Secondary | ICD-10-CM | POA: Diagnosis not present

## 2020-05-14 DIAGNOSIS — O4593 Premature separation of placenta, unspecified, third trimester: Secondary | ICD-10-CM | POA: Diagnosis present

## 2020-05-14 DIAGNOSIS — O471 False labor at or after 37 completed weeks of gestation: Secondary | ICD-10-CM | POA: Diagnosis not present

## 2020-05-14 DIAGNOSIS — Z87891 Personal history of nicotine dependence: Secondary | ICD-10-CM | POA: Diagnosis not present

## 2020-05-14 DIAGNOSIS — O9081 Anemia of the puerperium: Secondary | ICD-10-CM | POA: Diagnosis not present

## 2020-05-14 DIAGNOSIS — O139 Gestational [pregnancy-induced] hypertension without significant proteinuria, unspecified trimester: Secondary | ICD-10-CM | POA: Diagnosis present

## 2020-05-14 DIAGNOSIS — J4550 Severe persistent asthma, uncomplicated: Secondary | ICD-10-CM | POA: Diagnosis present

## 2020-05-14 DIAGNOSIS — O134 Gestational [pregnancy-induced] hypertension without significant proteinuria, complicating childbirth: Secondary | ICD-10-CM | POA: Diagnosis present

## 2020-05-14 DIAGNOSIS — O99824 Streptococcus B carrier state complicating childbirth: Secondary | ICD-10-CM | POA: Diagnosis present

## 2020-05-14 DIAGNOSIS — O99820 Streptococcus B carrier state complicating pregnancy: Secondary | ICD-10-CM

## 2020-05-14 DIAGNOSIS — O365930 Maternal care for other known or suspected poor fetal growth, third trimester, not applicable or unspecified: Secondary | ICD-10-CM | POA: Diagnosis not present

## 2020-05-14 DIAGNOSIS — F1290 Cannabis use, unspecified, uncomplicated: Secondary | ICD-10-CM | POA: Diagnosis present

## 2020-05-14 DIAGNOSIS — Z3A39 39 weeks gestation of pregnancy: Secondary | ICD-10-CM

## 2020-05-14 DIAGNOSIS — O09899 Supervision of other high risk pregnancies, unspecified trimester: Secondary | ICD-10-CM

## 2020-05-14 DIAGNOSIS — S3769XA Other injury of uterus, initial encounter: Secondary | ICD-10-CM | POA: Diagnosis present

## 2020-05-14 DIAGNOSIS — O0930 Supervision of pregnancy with insufficient antenatal care, unspecified trimester: Secondary | ICD-10-CM

## 2020-05-14 DIAGNOSIS — O909 Complication of the puerperium, unspecified: Secondary | ICD-10-CM

## 2020-05-14 DIAGNOSIS — J45909 Unspecified asthma, uncomplicated: Secondary | ICD-10-CM | POA: Diagnosis present

## 2020-05-14 LAB — RESP PANEL BY RT-PCR (FLU A&B, COVID) ARPGX2
Influenza A by PCR: NEGATIVE
Influenza B by PCR: NEGATIVE
SARS Coronavirus 2 by RT PCR: NEGATIVE

## 2020-05-14 LAB — CBC
HCT: 35.1 % — ABNORMAL LOW (ref 36.0–46.0)
HCT: 34.1 % — ABNORMAL LOW (ref 36.0–46.0)
Hemoglobin: 11.3 g/dL — ABNORMAL LOW (ref 12.0–15.0)
Hemoglobin: 11 g/dL — ABNORMAL LOW (ref 12.0–15.0)
MCH: 27.4 pg (ref 26.0–34.0)
MCH: 27.6 pg (ref 26.0–34.0)
MCHC: 32.2 g/dL (ref 30.0–36.0)
MCHC: 32.3 g/dL (ref 30.0–36.0)
MCV: 85 fL (ref 80.0–100.0)
MCV: 85.5 fL (ref 80.0–100.0)
Platelets: 271 10*3/uL (ref 150–400)
Platelets: 241 10*3/uL (ref 150–400)
RBC: 4.13 MIL/uL (ref 3.87–5.11)
RBC: 3.99 MIL/uL (ref 3.87–5.11)
RDW: 14 % (ref 11.5–15.5)
RDW: 13.8 % (ref 11.5–15.5)
WBC: 8.1 10*3/uL (ref 4.0–10.5)
WBC: 8.2 10*3/uL (ref 4.0–10.5)
nRBC: 0 % (ref 0.0–0.2)
nRBC: 0 % (ref 0.0–0.2)

## 2020-05-14 LAB — COMPREHENSIVE METABOLIC PANEL
ALT: 14 U/L (ref 0–44)
AST: 20 U/L (ref 15–41)
Albumin: 2.6 g/dL — ABNORMAL LOW (ref 3.5–5.0)
Alkaline Phosphatase: 186 U/L — ABNORMAL HIGH (ref 38–126)
Anion gap: 6 (ref 5–15)
BUN: 5 mg/dL — ABNORMAL LOW (ref 6–20)
CO2: 24 mmol/L (ref 22–32)
Calcium: 8.4 mg/dL — ABNORMAL LOW (ref 8.9–10.3)
Chloride: 106 mmol/L (ref 98–111)
Creatinine, Ser: 0.74 mg/dL (ref 0.44–1.00)
GFR, Estimated: >60 mL/min (ref >60)
Glucose, Bld: 69 mg/dL — ABNORMAL LOW (ref 70–99)
Potassium: 3.8 mmol/L (ref 3.5–5.1)
Sodium: 136 mmol/L (ref 135–145)
Total Bilirubin: 0.6 mg/dL (ref 0.3–1.2)
Total Protein: 5.7 g/dL — ABNORMAL LOW (ref 6.5–8.1)

## 2020-05-14 LAB — PROTEIN / CREATININE RATIO, URINE
Creatinine, Urine: 32.73 mg/dL
Total Protein, Urine: <6 mg/dL

## 2020-05-14 LAB — TYPE AND SCREEN
ABO/RH(D): AB POS
Antibody Screen: NEGATIVE

## 2020-05-14 LAB — RPR: RPR Ser Ql: NONREACTIVE

## 2020-05-14 SURGERY — Surgical Case
Anesthesia: Epidural

## 2020-05-14 MED ORDER — FENTANYL CITRATE (PF) 100 MCG/2ML IJ SOLN
INTRAMUSCULAR | Status: AC
  Filled 2020-05-14: qty 2

## 2020-05-14 MED ORDER — TRANEXAMIC ACID-NACL 1000-0.7 MG/100ML-% IV SOLN
INTRAVENOUS | Status: AC
  Filled 2020-05-14: qty 100

## 2020-05-14 MED ORDER — DEXMEDETOMIDINE (PRECEDEX) IN NS 20 MCG/5ML (4 MCG/ML) IV SYRINGE
PREFILLED_SYRINGE | INTRAVENOUS | Status: DC | PRN
  Administered 2020-05-14: 12 ug via INTRAVENOUS
  Administered 2020-05-15: 8 ug via INTRAVENOUS

## 2020-05-14 MED ORDER — DEXAMETHASONE SODIUM PHOSPHATE 10 MG/ML IJ SOLN
INTRAMUSCULAR | Status: AC
  Filled 2020-05-14: qty 1

## 2020-05-14 MED ORDER — FENTANYL CITRATE (PF) 100 MCG/2ML IJ SOLN
INTRAMUSCULAR | Status: DC | PRN
  Administered 2020-05-14: 100 ug via INTRAVENOUS

## 2020-05-14 MED ORDER — DIPHENHYDRAMINE HCL 50 MG/ML IJ SOLN: 12.5000 mg | INTRAMUSCULAR | Status: DC | PRN

## 2020-05-14 MED ORDER — LACTATED RINGERS IV SOLN: 500.0000 mL | INTRAVENOUS | Status: DC | PRN

## 2020-05-14 MED ORDER — TERBUTALINE SULFATE 1 MG/ML IJ SOLN: 0.2500 mg | Freq: Once | INTRAMUSCULAR | Status: DC | PRN

## 2020-05-14 MED ORDER — DEXAMETHASONE SODIUM PHOSPHATE 10 MG/ML IJ SOLN
INTRAMUSCULAR | Status: DC | PRN
  Administered 2020-05-14: 10 mg via INTRAVENOUS
  Administered 2020-05-15: 5 mg via INTRAVENOUS

## 2020-05-14 MED ORDER — LIDOCAINE HCL (PF) 1 % IJ SOLN: 30.0000 mL | INTRAMUSCULAR | Status: DC | PRN

## 2020-05-14 MED ORDER — OXYCODONE-ACETAMINOPHEN 5-325 MG PO TABS: 2.0000 | ORAL_TABLET | ORAL | Status: DC | PRN

## 2020-05-14 MED ORDER — FENTANYL CITRATE (PF) 100 MCG/2ML IJ SOLN: 100.0000 ug | INTRAMUSCULAR | Status: DC | PRN

## 2020-05-14 MED ORDER — FENTANYL-BUPIVACAINE-NACL 0.5-0.125-0.9 MG/250ML-% EP SOLN
12.0000 mL/h | EPIDURAL | Status: DC | PRN
  Filled 2020-05-14: qty 250

## 2020-05-14 MED ORDER — ACETAMINOPHEN 325 MG PO TABS: 650.0000 mg | ORAL_TABLET | ORAL | Status: DC | PRN

## 2020-05-14 MED ORDER — DEXMEDETOMIDINE (PRECEDEX) IN NS 20 MCG/5ML (4 MCG/ML) IV SYRINGE
PREFILLED_SYRINGE | INTRAVENOUS | Status: AC
  Filled 2020-05-14: qty 5

## 2020-05-14 MED ORDER — TRANEXAMIC ACID-NACL 1000-0.7 MG/100ML-% IV SOLN
INTRAVENOUS | Status: DC | PRN
  Administered 2020-05-14: 1000 mg via INTRAVENOUS

## 2020-05-14 MED ORDER — TERBUTALINE SULFATE 1 MG/ML IJ SOLN
INTRAMUSCULAR | Status: AC
  Filled 2020-05-14: qty 1

## 2020-05-14 MED ORDER — ONDANSETRON HCL 4 MG/2ML IJ SOLN
INTRAMUSCULAR | Status: DC | PRN
  Administered 2020-05-14: 4 mg via INTRAVENOUS

## 2020-05-14 MED ORDER — OXYCODONE-ACETAMINOPHEN 5-325 MG PO TABS: 1.0000 | ORAL_TABLET | ORAL | Status: DC | PRN

## 2020-05-14 MED ORDER — OXYTOCIN-SODIUM CHLORIDE 30-0.9 UT/500ML-% IV SOLN: 2.5000 [IU]/h | INTRAVENOUS | Status: DC

## 2020-05-14 MED ORDER — EPHEDRINE 5 MG/ML INJ: 10.0000 mg | INTRAVENOUS | Status: DC | PRN

## 2020-05-14 MED ORDER — LACTATED RINGERS IV SOLN: INTRAVENOUS | Status: DC

## 2020-05-14 MED ORDER — OXYTOCIN BOLUS FROM INFUSION: 333.0000 mL | Freq: Once | INTRAVENOUS | Status: DC

## 2020-05-14 MED ORDER — ONDANSETRON HCL 4 MG/2ML IJ SOLN: 4.0000 mg | Freq: Four times a day (QID) | INTRAMUSCULAR | Status: DC | PRN

## 2020-05-14 MED ORDER — CHLOROPROCAINE HCL (PF) 3 % IJ SOLN: INTRAMUSCULAR | Status: DC | PRN

## 2020-05-14 MED ORDER — OXYTOCIN-SODIUM CHLORIDE 30-0.9 UT/500ML-% IV SOLN
INTRAVENOUS | Status: DC | PRN
  Administered 2020-05-14: 500 mL via INTRAVENOUS

## 2020-05-14 MED ORDER — PENICILLIN G POT IN DEXTROSE 60000 UNIT/ML IV SOLN
3.0000 10*6.[IU] | INTRAVENOUS | Status: DC
  Administered 2020-05-14 (×3): 3 10*6.[IU] via INTRAVENOUS
  Filled 2020-05-14 (×3): qty 50

## 2020-05-14 MED ORDER — SOD CITRATE-CITRIC ACID 500-334 MG/5ML PO SOLN: 30.0000 mL | ORAL | Status: DC | PRN

## 2020-05-14 MED ORDER — OXYTOCIN-SODIUM CHLORIDE 30-0.9 UT/500ML-% IV SOLN
INTRAVENOUS | Status: AC
  Filled 2020-05-14: qty 500

## 2020-05-14 MED ORDER — LACTATED RINGERS IV SOLN: 500.0000 mL | Freq: Once | INTRAVENOUS | Status: DC

## 2020-05-14 MED ORDER — OXYTOCIN-SODIUM CHLORIDE 30-0.9 UT/500ML-% IV SOLN
1.0000 m[IU]/min | INTRAVENOUS | Status: DC
  Filled 2020-05-14: qty 500

## 2020-05-14 MED ORDER — PHENYLEPHRINE 40 MCG/ML (10ML) SYRINGE FOR IV PUSH (FOR BLOOD PRESSURE SUPPORT): 80.0000 ug | PREFILLED_SYRINGE | INTRAVENOUS | Status: DC | PRN

## 2020-05-14 MED ORDER — CEFAZOLIN SODIUM-DEXTROSE 2-3 GM-%(50ML) IV SOLR
INTRAVENOUS | Status: DC | PRN
  Administered 2020-05-14: 2 g via INTRAVENOUS

## 2020-05-14 MED ORDER — FENTANYL-BUPIVACAINE-NACL 0.5-0.125-0.9 MG/250ML-% EP SOLN
EPIDURAL | Status: DC | PRN
  Administered 2020-05-14: 12 mL/h via EPIDURAL

## 2020-05-14 MED ORDER — CHLOROPROCAINE HCL (PF) 3 % IJ SOLN
INTRAMUSCULAR | Status: AC
  Filled 2020-05-14: qty 20

## 2020-05-14 MED ORDER — SODIUM CHLORIDE 0.9 % IV SOLN
5.0000 10*6.[IU] | Freq: Once | INTRAVENOUS | Status: AC
  Administered 2020-05-14: 5 10*6.[IU] via INTRAVENOUS
  Filled 2020-05-14: qty 5

## 2020-05-14 MED ORDER — OXYTOCIN-SODIUM CHLORIDE 30-0.9 UT/500ML-% IV SOLN
1.0000 m[IU]/min | INTRAVENOUS | Status: DC
  Administered 2020-05-14: 2 m[IU]/min via INTRAVENOUS

## 2020-05-14 MED ORDER — CEFAZOLIN SODIUM-DEXTROSE 2-4 GM/100ML-% IV SOLN
INTRAVENOUS | Status: AC
  Filled 2020-05-14: qty 100

## 2020-05-14 MED ORDER — LIDOCAINE HCL (PF) 1 % IJ SOLN
INTRAMUSCULAR | Status: DC | PRN
  Administered 2020-05-14: 5 mL via EPIDURAL

## 2020-05-14 MED ORDER — ONDANSETRON HCL 4 MG/2ML IJ SOLN
INTRAMUSCULAR | Status: AC
  Filled 2020-05-14: qty 2

## 2020-05-14 MED ORDER — CHLOROPROCAINE HCL (PF) 3 % IJ SOLN
INTRAMUSCULAR | Status: DC | PRN
  Administered 2020-05-14: 10 mL

## 2020-05-14 SURGICAL SUPPLY — 42 items
BENZOIN TINCTURE PRP APPL 2/3 (GAUZE/BANDAGES/DRESSINGS) ×2 IMPLANT
CHLORAPREP W/TINT 26ML (MISCELLANEOUS) ×2 IMPLANT
CLAMP CORD UMBIL (MISCELLANEOUS) IMPLANT
CLOTH BEACON ORANGE TIMEOUT ST (SAFETY) ×2 IMPLANT
DERMABOND ADVANCED (GAUZE/BANDAGES/DRESSINGS)
DERMABOND ADVANCED .7 DNX12 (GAUZE/BANDAGES/DRESSINGS) IMPLANT
DRSG OPSITE POSTOP 4X10 (GAUZE/BANDAGES/DRESSINGS) ×2 IMPLANT
ELECT REM PT RETURN 9FT ADLT (ELECTROSURGICAL) ×2
ELECTRODE REM PT RTRN 9FT ADLT (ELECTROSURGICAL) ×1 IMPLANT
EXTRACTOR VACUUM KIWI (MISCELLANEOUS) IMPLANT
GAUZE SPONGE 4X4 12PLY STRL LF (GAUZE/BANDAGES/DRESSINGS) ×4 IMPLANT
GLOVE BIOGEL PI IND STRL 7.0 (GLOVE) ×3 IMPLANT
GLOVE BIOGEL PI INDICATOR 7.0 (GLOVE) ×3
GLOVE ECLIPSE 6.5 STRL STRAW (GLOVE) ×2 IMPLANT
GOWN STRL REUS W/TWL LRG LVL3 (GOWN DISPOSABLE) ×6 IMPLANT
HEMOSTAT SURGICEL 4X8 (HEMOSTASIS) ×2 IMPLANT
KIT ABG SYR 3ML LUER SLIP (SYRINGE) IMPLANT
NEEDLE HYPO 25X5/8 SAFETYGLIDE (NEEDLE) IMPLANT
NS IRRIG 1000ML POUR BTL (IV SOLUTION) ×2 IMPLANT
PACK C SECTION WH (CUSTOM PROCEDURE TRAY) ×2 IMPLANT
PAD ABD 7.5X8 STRL (GAUZE/BANDAGES/DRESSINGS) ×2 IMPLANT
PAD OB MATERNITY 4.3X12.25 (PERSONAL CARE ITEMS) ×2 IMPLANT
PENCIL SMOKE EVAC W/HOLSTER (ELECTROSURGICAL) ×2 IMPLANT
RTRCTR C-SECT PINK 25CM LRG (MISCELLANEOUS) ×2 IMPLANT
SPONGE LAP 18X18 RF (DISPOSABLE) ×2 IMPLANT
SPONGE LAP 18X18 X RAY DECT (DISPOSABLE) ×2 IMPLANT
SPONGE LAP 4X18 RFD (DISPOSABLE) ×2 IMPLANT
STAPLE OSTEOSYNTHE COMPRESSION (GAUZE/BANDAGES/DRESSINGS) ×2 IMPLANT
STAPLER VISISTAT 35W (STAPLE) ×2 IMPLANT
STRIP CLOSURE SKIN 1/2X4 (GAUZE/BANDAGES/DRESSINGS) ×2 IMPLANT
SUT PLAIN 0 NONE (SUTURE) IMPLANT
SUT PLAIN 2 0 XLH (SUTURE) IMPLANT
SUT VIC AB 0 CT1 27 (SUTURE) ×4
SUT VIC AB 0 CT1 27XBRD ANBCTR (SUTURE) ×2 IMPLANT
SUT VIC AB 0 CTX 36 (SUTURE) ×8
SUT VIC AB 0 CTX36XBRD ANBCTRL (SUTURE) ×4 IMPLANT
SUT VIC AB 2-0 CT1 27 (SUTURE) ×10
SUT VIC AB 2-0 CT1 TAPERPNT 27 (SUTURE) ×5 IMPLANT
SUT VIC AB 4-0 KS 27 (SUTURE) ×2 IMPLANT
TOWEL OR 17X24 6PK STRL BLUE (TOWEL DISPOSABLE) ×2 IMPLANT
TRAY FOLEY W/BAG SLVR 14FR LF (SET/KITS/TRAYS/PACK) IMPLANT
WATER STERILE IRR 1000ML POUR (IV SOLUTION) ×2 IMPLANT

## 2020-05-14 NOTE — H&P (Signed)
OBSTETRIC ADMISSION HISTORY AND PHYSICAL  Tammy [name] is a 25 y.o. female G5P2113 with IUP at [redacted]w[redacted]d by LMP presenting for IOL-FGR, TOLAC. She reports +FMs, No LOF, no VB, no blurry vision, headaches or peripheral edema, and RUQ pain.  She plans on bottle feeding. She request depo for birth control. She received her prenatal care at [facility]   Dating: By LMP --->  Estimated Date of Delivery: 05/19/20  Sono:    05/12/20@[redacted]w[redacted]d, CWD, normal anatomy, cephalic presentation, anterior placental lie, 2634g, 3.3% EFW   Prenatal History/Complications:  FGR History of PTD (s/p Makena this pregnancy) History of cesarean section (arrest of dilation) Poorly controlled asthma (PRN albuterol) GBS positive  Past Medical History: Past Medical History:  Diagnosis Date  . Arthritis   . Asthma    used inhaler 1 wk ago  . Chlamydia infection   . History of chicken pox   . Preterm delivery     Past Surgical History: Past Surgical History:  Procedure Laterality Date  . CESAREAN SECTION      Obstetrical History: OB History    Gravida  5   Para  3   Term  2   Preterm  1   AB  1   Living  3     SAB  1   IAB      Ectopic      Multiple      Live Births  3           Social History Social History   Socioeconomic History  . Marital status: Single    Spouse name: Not on file  . Number of children: Not on file  . Years of education: Not on file  . Highest education level: Not on file  Occupational History  . Not on file  Tobacco Use  . Smoking status: Former Smoker    Packs/day: 0.25    Types: Cigarettes  . Smokeless tobacco: Never Used  Vaping Use  . Vaping Use: Never used  Substance and Sexual Activity  . Alcohol use: Not Currently    Comment: occ  . Drug use: No  . Sexual activity: Yes    Birth control/protection: None  Other Topics Concern  . Not on file  Social History Narrative   ** Merged History Encounter **       Social Determinants of Health    Financial Resource Strain: Not on file  Food Insecurity: Not on file  Transportation Needs: Not on file  Physical Activity: Not on file  Stress: Not on file  Social Connections: Not on file    Family History: Family History  Problem Relation Age of Onset  . Migraines Mother   . Mitral valve prolapse Mother   . Anemia Maternal Aunt   . Mitral valve prolapse Maternal Aunt   . Heart disease Paternal Uncle   . Mitral valve prolapse Maternal Grandmother   . Dementia Maternal Grandmother   . Kidney disease Paternal Grandmother        dialysis  . Cancer Paternal Grandfather        Lung    Allergies: Allergies  Allergen Reactions  . Pollen Extract Swelling    Medications Prior to Admission  Medication Sig Dispense Refill Last Dose  . albuterol (VENTOLIN HFA) 108 (90 Base) MCG/ACT inhaler Inhale 2 puffs into the lungs every 4 (four) hours as needed for wheezing or shortness of breath. 18 g 3   . aspirin 81 MG chewable tablet Chew 1 tablet (  81 mg total) by mouth daily. 30 tablet 5   . Blood Pressure Monitoring (BLOOD PRESSURE KIT) DEVI 1 Device by Does not apply route as needed. 1 each 0   . Budesonide 90 MCG/ACT inhaler Inhale 2 puffs into the lungs 2 (two) times daily. 1 each 3   . HYDROXYprogesterone caproate (MAKENA) autoinjector Inject 275 mg into the skin every 7 (seven) days. 1.05 mL 14   . Prenatal Vit-Fe Phos-FA-Omega (VITAFOL GUMMIES) 3.33-0.333-34.8 MG CHEW Chew 3 tablets by mouth daily. 90 tablet 11      Review of Systems   All systems reviewed and negative except as stated in HPI  Height 5' 3" (1.6 m), weight 64 kg, last menstrual period 08/13/2019, unknown if currently breastfeeding. General appearance: alert, cooperative and no distress Lungs: normal respiratory effort Heart: regular rate and rhythm Abdomen: soft, non-tender; gravid Pelvic: as noted below Extremities: Homans sign is negative, no sign of DVT Presentation: cephalic by cervical exam Fetal  monitoringBaseline: 130 bpm, Variability: Good {> 6 bpm), Accelerations: Reactive and Decelerations: Absent Uterine activity intermittent     Prenatal labs: ABO, Rh:  AB pos Antibody:  neg Rubella:  imm RPR:   nonreactive HBsAg:   nonreactive HIV:   neg GBS: Positive/-- (03/29 0906)  1 hr Glucola not done Genetic screening  Not done Anatomy US normal per patient, unable to see report through [place]  Prenatal Transfer Tool  Maternal Diabetes: No-gtt not done Genetic Screening: Declined Maternal Ultrasounds/Referrals: Normal Fetal Ultrasounds or other Referrals:  None Maternal Substance Abuse:  Yes:  Type: Marijuana Significant Maternal Medications:  Meds include: Other: albuterol Significant Maternal Lab Results: Group B Strep positive  No results found for this or any previous visit (from the past 24 hour(s)).  Patient Active Problem List   Diagnosis Date Noted  . Group B Streptococcus carrier, +RV culture, currently pregnant 04/29/2020  . History of inadequate prenatal care 04/15/2020  . History of cesarean delivery, currently pregnant 10/22/2019  . History of prior pregnancy with IUGR newborn 01/31/2019  . History of preterm delivery, currently pregnant   . Asthma, severe persistent, poorly-controlled 06/22/2012  . Asthma, chronic 06/21/2012  . Asthma 11/05/2011    Assessment/Plan:  [name] is a 25 y.o. G5P2113 at [redacted]w[redacted]d here for IOL-FGR.  #IOL: TOLAC. Discussed IOL process with patient. Given cervical exam will start low dose pitocin and titrate 2x2 up to 6mL/hr.  #Pain: PRN, desires epidural #FWB: Cat 1 #ID: GBS +, PCN #MOF: bottle #MOC:depo #Circ: n/a #Tobacco abuse: stopped at 20 weeks. #THC use: SW postpartum #Asthma: poorly controlled, albuterol PRN. No history of hospitalization/intubation.  Alicia C Firestone, MD  05/14/2020, 8:08 AM    

## 2020-05-14 NOTE — Anesthesia Procedure Notes (Signed)
Epidural Patient location during procedure: OB Start time: 05/14/2020 8:24 PM End time: 05/14/2020 8:38 PM  Staffing Anesthesiologist: Trevor Iha, MD Performed: anesthesiologist   Preanesthetic Checklist Completed: patient identified, IV checked, site marked, risks and benefits discussed, surgical consent, monitors and equipment checked, pre-op evaluation and timeout performed  Epidural Patient position: sitting Prep: DuraPrep and site prepped and draped Patient monitoring: continuous pulse ox and blood pressure Approach: midline Location: L3-L4 Injection technique: LOR air  Needle:  Needle type: Tuohy  Needle gauge: 17 G Needle length: 9 cm and 9 Needle insertion depth: 7 cm Catheter type: closed end flexible Catheter size: 19 Gauge Catheter at skin depth: 12 cm Test dose: negative  Assessment Events: blood not aspirated, injection not painful, no injection resistance, no paresthesia and negative IV test  Additional Notes Patient identified. Risks/Benefits/Options discussed with patient including but not limited to bleeding, infection, nerve damage, paralysis, failed block, incomplete pain control, headache, blood pressure changes, nausea, vomiting, reactions to medication both or allergic, itching and postpartum back pain. Confirmed with bedside nurse the patient's most recent platelet count. Confirmed with patient that they are not currently taking any anticoagulation, have any bleeding history or any family history of bleeding disorders. Patient expressed understanding and wished to proceed. All questions were answered. Sterile technique was used throughout the entire procedure. Please see nursing notes for vital signs. Test dose was given through epidural needle and negative prior to continuing to dose epidural or start infusion. Warning signs of high block given to the patient including shortness of breath, tingling/numbness in hands, complete motor block, or any  concerning symptoms with instructions to call for help. Patient was given instructions on fall risk and not to get out of bed. All questions and concerns addressed with instructions to call with any issues. 1 Attempt (S) . Patient tolerated procedure well.

## 2020-05-14 NOTE — Progress Notes (Signed)
Labor Progress Note Tammy [name] is a 25 y.o. [phone] at [redacted]w[redacted]d presented for IOL-FGR, TOLAC. S: Doing well, sleeping.  O:  BP (!) 117/57   Pulse 63   Temp 98.5 F (36.9 C) (Oral)   Resp 16   Ht 5\' 3"  (1.6 m)   Wt 64 kg   LMP 08/13/2019 (Exact Date)   BMI 25.01 kg/m  EFM: baseline 130bpm/mod variability/+ accels/no decels Toco: q1-7 min  CVE: Dilation: 2.5 Effacement (%): 50 Station: -3 Presentation: Vertex Exam by:: Dr. [email or web address]   A&P: 25 y.o. 22 [redacted]w[redacted]d presented for IOL-FGR, TOLAC. #IOL/TOLAC: Patient has made good cervical change since last check. Will continue low dose pitocin for now and titrate up once cervix thinned a bit more. #Pain: PRN #FWB: cat 1 #GBS positive, PCN, adequate prophylaxis  #Tobacco abuse: stopped at 20 weeks. #THC use: SW postpartum #Asthma: poorly controlled, albuterol PRN. No history of hospitalization/intubation.  [redacted]w[redacted]d, MD 12:35 PM

## 2020-05-14 NOTE — Progress Notes (Signed)
Reported to bedside with RN to perform cervical check. Patient making slow cervical change. Due to fetal station, will defer AROM at this time. Patient doing well without complaints. Consider AROM at next check. Continue to titrate pitocin.  Dilation: 4 Effacement (%): 50 Station: -3 Presentation: Vertex Exam by:: Dr. Pervis Hocking, MD OB Fellow, Faculty Practice [facility], [facility] 05/14/2020 6:27 PM

## 2020-05-14 NOTE — Progress Notes (Signed)
Labor Progress Note Tammy [name] is a 25 y.o. [phone] at [redacted]w[redacted]d presented for IOL-FGR, TOLAC. S: Doing well, sleeping.  O:  BP 126/67   Pulse 65   Temp 98.1 F (36.7 C) (Oral)   Resp 16   Ht 5\' 3"  (1.6 m)   Wt 64 kg   LMP 08/13/2019 (Exact Date)   BMI 25.01 kg/m  EFM: baseline 130bpm/mod variability/+ accels/no decels Toco: q1-5 min  CVE: Dilation: 2.5 Effacement (%): 50 Station: -3 Presentation: Vertex Exam by:: Dr. [email or web address]   A&P: 25 y.o. 22 [redacted]w[redacted]d presented for IOL-FGR, TOLAC. #IOL/TOLAC: Will continue to titrate up pitocin at this time. #Pain: PRN #FWB: cat 1 #GBS positive, PCN, adequate prophylaxis  #Tobacco abuse: stopped at 20 weeks. #THC use: SW postpartum #Asthma: poorly controlled, albuterol PRN. No history of hospitalization/intubation.  [redacted]w[redacted]d, MD 4:36 PM

## 2020-05-14 NOTE — Anesthesia Preprocedure Evaluation (Signed)
Anesthesia Evaluation  Patient identified by MRN, date of birth, ID band Patient awake    Reviewed: Allergy & Precautions, NPO status , Patient's Chart, lab work & pertinent test results  Airway Mallampati: II  TM Distance: >3 FB Neck ROM: Full    Dental no notable dental hx. (+) Teeth Intact, Dental Advisory Given   Pulmonary asthma , former smoker,    Pulmonary exam normal breath sounds clear to auscultation       Cardiovascular negative cardio ROS Normal cardiovascular exam Rhythm:Regular Rate:Normal     Neuro/Psych negative psych ROS   GI/Hepatic negative GI ROS, Neg liver ROS,   Endo/Other  negative endocrine ROS  Renal/GU negative Renal ROS     Musculoskeletal  (+) Arthritis ,   Abdominal   Peds  Hematology Lab Results      Component                Value               Date                      WBC                      8.2                 05/14/2020                HGB                      11.0 (L)            05/14/2020                HCT                      34.1 (L)            05/14/2020                MCV                      85.5                05/14/2020                PLT                      241                 05/14/2020              Anesthesia Other Findings   Reproductive/Obstetrics (+) Pregnancy                             Anesthesia Physical Anesthesia Plan  ASA: II  Anesthesia Plan: Epidural   Post-op Pain Management:    Induction:   PONV Risk Score and Plan:   Airway Management Planned:   Additional Equipment:   Intra-op Plan:   Post-operative Plan:   Informed Consent: I have reviewed the patients History and Physical, chart, labs and discussed the procedure including the risks, benefits and alternatives for the proposed anesthesia with the patient or authorized representative who has indicated his/her understanding and acceptance.       Plan  Discussed with:   Anesthesia Plan Comments: (39.2 Wk G5P3  for [facility])        Anesthesia Quick Evaluation

## 2020-05-15 ENCOUNTER — Encounter (HOSPITAL_COMMUNITY): Payer: Self-pay | Admitting: Family Medicine

## 2020-05-15 ENCOUNTER — Inpatient Hospital Stay (HOSPITAL_COMMUNITY): Payer: Medicaid Other

## 2020-05-15 DIAGNOSIS — O99824 Streptococcus B carrier state complicating childbirth: Secondary | ICD-10-CM

## 2020-05-15 DIAGNOSIS — O34211 Maternal care for low transverse scar from previous cesarean delivery: Secondary | ICD-10-CM

## 2020-05-15 DIAGNOSIS — S3769XA Other injury of uterus, initial encounter: Secondary | ICD-10-CM | POA: Diagnosis present

## 2020-05-15 DIAGNOSIS — Z3A39 39 weeks gestation of pregnancy: Secondary | ICD-10-CM

## 2020-05-15 DIAGNOSIS — O365930 Maternal care for other known or suspected poor fetal growth, third trimester, not applicable or unspecified: Secondary | ICD-10-CM

## 2020-05-15 LAB — CREATININE, SERUM
Creatinine, Ser: 0.77 mg/dL (ref 0.44–1.00)
GFR, Estimated: >60 mL/min (ref >60)

## 2020-05-15 LAB — CBC
HCT: 29.7 % — ABNORMAL LOW (ref 36.0–46.0)
Hemoglobin: 9.5 g/dL — ABNORMAL LOW (ref 12.0–15.0)
MCH: 27.6 pg (ref 26.0–34.0)
MCHC: 32 g/dL (ref 30.0–36.0)
MCV: 86.3 fL (ref 80.0–100.0)
Platelets: 239 10*3/uL (ref 150–400)
RBC: 3.44 MIL/uL — ABNORMAL LOW (ref 3.87–5.11)
RDW: 13.7 % (ref 11.5–15.5)
WBC: 19.2 10*3/uL — ABNORMAL HIGH (ref 4.0–10.5)
nRBC: 0 % (ref 0.0–0.2)

## 2020-05-15 MED ORDER — ACETAMINOPHEN 10 MG/ML IV SOLN
INTRAVENOUS | Status: AC
  Filled 2020-05-15: qty 100

## 2020-05-15 MED ORDER — DIPHENHYDRAMINE HCL 25 MG PO CAPS: 25.0000 mg | ORAL_CAPSULE | Freq: Four times a day (QID) | ORAL | Status: DC | PRN

## 2020-05-15 MED ORDER — DIPHENHYDRAMINE HCL 25 MG PO CAPS: 25.0000 mg | ORAL_CAPSULE | ORAL | Status: DC | PRN

## 2020-05-15 MED ORDER — NALOXONE HCL 4 MG/10ML IJ SOLN
1.0000 ug/kg/h | INTRAVENOUS | Status: DC | PRN
  Filled 2020-05-15: qty 5

## 2020-05-15 MED ORDER — LACTATED RINGERS IV SOLN: INTRAVENOUS | Status: DC

## 2020-05-15 MED ORDER — ALBUTEROL SULFATE (2.5 MG/3ML) 0.083% IN NEBU: 2.5000 mg | INHALATION_SOLUTION | RESPIRATORY_TRACT | Status: DC | PRN

## 2020-05-15 MED ORDER — KETOROLAC TROMETHAMINE 30 MG/ML IJ SOLN: 30.0000 mg | Freq: Once | INTRAMUSCULAR | Status: DC | PRN

## 2020-05-15 MED ORDER — DIBUCAINE (PERIANAL) 1 % EX OINT: 1.0000 "application " | TOPICAL_OINTMENT | CUTANEOUS | Status: DC | PRN

## 2020-05-15 MED ORDER — PRENATAL MULTIVITAMIN CH
1.0000 | ORAL_TABLET | Freq: Every day | ORAL | Status: DC
  Administered 2020-05-16: 1 via ORAL
  Filled 2020-05-15: qty 1

## 2020-05-15 MED ORDER — NALBUPHINE HCL 10 MG/ML IJ SOLN
5.0000 mg | Freq: Once | INTRAMUSCULAR | Status: DC | PRN
Start: 2020-05-15 — End: 2020-05-17

## 2020-05-15 MED ORDER — OXYCODONE HCL 5 MG/5ML PO SOLN: 5.0000 mg | Freq: Once | ORAL | Status: DC | PRN

## 2020-05-15 MED ORDER — FERROUS SULFATE 325 (65 FE) MG PO TABS
325.0000 mg | ORAL_TABLET | ORAL | Status: DC
  Administered 2020-05-17: 325 mg via ORAL
  Filled 2020-05-15 (×2): qty 1

## 2020-05-15 MED ORDER — OXYTOCIN-SODIUM CHLORIDE 30-0.9 UT/500ML-% IV SOLN: 2.5000 [IU]/h | INTRAVENOUS | Status: AC

## 2020-05-15 MED ORDER — NALBUPHINE HCL 10 MG/ML IJ SOLN: 5.0000 mg | Freq: Once | INTRAMUSCULAR | Status: DC | PRN

## 2020-05-15 MED ORDER — OXYTOCIN-SODIUM CHLORIDE 30-0.9 UT/500ML-% IV SOLN
INTRAVENOUS | Status: AC
  Filled 2020-05-15: qty 500

## 2020-05-15 MED ORDER — HYDROMORPHONE HCL 1 MG/ML IJ SOLN
INTRAMUSCULAR | Status: AC
  Filled 2020-05-15: qty 0.5

## 2020-05-15 MED ORDER — NALBUPHINE HCL 10 MG/ML IJ SOLN
5.0000 mg | INTRAMUSCULAR | Status: DC | PRN
Start: 2020-05-15 — End: 2020-05-17

## 2020-05-15 MED ORDER — MENTHOL 3 MG MT LOZG: 1.0000 | LOZENGE | OROMUCOSAL | Status: DC | PRN

## 2020-05-15 MED ORDER — ZOLPIDEM TARTRATE 5 MG PO TABS: 5.0000 mg | ORAL_TABLET | Freq: Every evening | ORAL | Status: DC | PRN

## 2020-05-15 MED ORDER — KETOROLAC TROMETHAMINE 30 MG/ML IJ SOLN
30.0000 mg | Freq: Four times a day (QID) | INTRAMUSCULAR | Status: AC
  Administered 2020-05-15 – 2020-05-16 (×3): 30 mg via INTRAVENOUS
  Filled 2020-05-15 (×3): qty 1

## 2020-05-15 MED ORDER — HYDROMORPHONE HCL 1 MG/ML IJ SOLN
0.2500 mg | INTRAMUSCULAR | Status: DC | PRN
  Administered 2020-05-15: 0.5 mg via INTRAVENOUS

## 2020-05-15 MED ORDER — SENNOSIDES-DOCUSATE SODIUM 8.6-50 MG PO TABS
2.0000 | ORAL_TABLET | ORAL | Status: DC
  Administered 2020-05-16 – 2020-05-17 (×2): 2 via ORAL
  Filled 2020-05-15 (×4): qty 2

## 2020-05-15 MED ORDER — SIMETHICONE 80 MG PO CHEW
80.0000 mg | CHEWABLE_TABLET | Freq: Three times a day (TID) | ORAL | Status: DC
  Administered 2020-05-15 – 2020-05-17 (×5): 80 mg via ORAL
  Filled 2020-05-15 (×7): qty 1

## 2020-05-15 MED ORDER — ONDANSETRON HCL 4 MG/2ML IJ SOLN
4.0000 mg | Freq: Three times a day (TID) | INTRAMUSCULAR | Status: DC | PRN
  Administered 2020-05-15: 4 mg via INTRAVENOUS
  Filled 2020-05-15: qty 2

## 2020-05-15 MED ORDER — ACETAMINOPHEN 500 MG PO TABS
1000.0000 mg | ORAL_TABLET | Freq: Four times a day (QID) | ORAL | Status: DC
  Administered 2020-05-15 – 2020-05-16 (×4): 1000 mg via ORAL
  Filled 2020-05-15 (×8): qty 2

## 2020-05-15 MED ORDER — TETANUS-DIPHTH-ACELL PERTUSSIS 5-2.5-18.5 LF-MCG/0.5 IM SUSY: 0.5000 mL | PREFILLED_SYRINGE | Freq: Once | INTRAMUSCULAR | Status: DC

## 2020-05-15 MED ORDER — ONDANSETRON HCL 4 MG/2ML IJ SOLN: 4.0000 mg | Freq: Once | INTRAMUSCULAR | Status: DC | PRN

## 2020-05-15 MED ORDER — ACETAMINOPHEN 10 MG/ML IV SOLN
INTRAVENOUS | Status: DC | PRN
  Administered 2020-05-15: 1000 mg via INTRAVENOUS

## 2020-05-15 MED ORDER — WITCH HAZEL-GLYCERIN EX PADS: 1.0000 "application " | MEDICATED_PAD | CUTANEOUS | Status: DC | PRN

## 2020-05-15 MED ORDER — MORPHINE SULFATE (PF) 0.5 MG/ML IJ SOLN
INTRAMUSCULAR | Status: DC | PRN
  Administered 2020-05-15: 3 mg via EPIDURAL

## 2020-05-15 MED ORDER — OXYCODONE HCL 5 MG PO TABS: 5.0000 mg | ORAL_TABLET | Freq: Once | ORAL | Status: DC | PRN

## 2020-05-15 MED ORDER — NALBUPHINE HCL 10 MG/ML IJ SOLN
5.0000 mg | INTRAMUSCULAR | Status: DC | PRN
  Administered 2020-05-15: 5 mg via INTRAVENOUS
  Filled 2020-05-15: qty 1

## 2020-05-15 MED ORDER — SODIUM CHLORIDE 0.9% FLUSH: 3.0000 mL | INTRAVENOUS | Status: DC | PRN

## 2020-05-15 MED ORDER — MORPHINE SULFATE (PF) 0.5 MG/ML IJ SOLN
INTRAMUSCULAR | Status: AC
  Filled 2020-05-15: qty 10

## 2020-05-15 MED ORDER — ENOXAPARIN SODIUM 40 MG/0.4ML ~~LOC~~ SOLN
40.0000 mg | SUBCUTANEOUS | Status: DC
  Administered 2020-05-15 – 2020-05-16 (×2): 40 mg via SUBCUTANEOUS
  Filled 2020-05-15 (×2): qty 0.4

## 2020-05-15 MED ORDER — KETOROLAC TROMETHAMINE 30 MG/ML IJ SOLN
INTRAMUSCULAR | Status: AC
  Filled 2020-05-15: qty 1

## 2020-05-15 MED ORDER — SCOPOLAMINE 1 MG/3DAYS TD PT72
1.0000 | MEDICATED_PATCH | Freq: Once | TRANSDERMAL | Status: DC
  Administered 2020-05-15: 1.5 mg via TRANSDERMAL
  Filled 2020-05-15: qty 1

## 2020-05-15 MED ORDER — GABAPENTIN 100 MG PO CAPS
100.0000 mg | ORAL_CAPSULE | Freq: Three times a day (TID) | ORAL | Status: DC
  Administered 2020-05-15 – 2020-05-17 (×6): 100 mg via ORAL
  Filled 2020-05-15 (×6): qty 1

## 2020-05-15 MED ORDER — IBUPROFEN 600 MG PO TABS
600.0000 mg | ORAL_TABLET | Freq: Four times a day (QID) | ORAL | Status: DC
  Administered 2020-05-16: 600 mg via ORAL
  Filled 2020-05-15 (×3): qty 1

## 2020-05-15 MED ORDER — ALBUTEROL SULFATE HFA 108 (90 BASE) MCG/ACT IN AERS
2.0000 | INHALATION_SPRAY | RESPIRATORY_TRACT | Status: DC | PRN
  Filled 2020-05-15: qty 6.7

## 2020-05-15 MED ORDER — MEDROXYPROGESTERONE ACETATE 150 MG/ML IM SUSP: 150.0000 mg | INTRAMUSCULAR | Status: DC | PRN

## 2020-05-15 MED ORDER — SIMETHICONE 80 MG PO CHEW: 80.0000 mg | CHEWABLE_TABLET | ORAL | Status: DC | PRN

## 2020-05-15 MED ORDER — OXYCODONE HCL 5 MG PO TABS
5.0000 mg | ORAL_TABLET | ORAL | Status: DC | PRN
  Administered 2020-05-16 (×2): 5 mg via ORAL
  Administered 2020-05-17 (×2): 10 mg via ORAL
  Filled 2020-05-15 (×2): qty 2
  Filled 2020-05-15 (×2): qty 1

## 2020-05-15 MED ORDER — NALOXONE HCL 0.4 MG/ML IJ SOLN: 0.4000 mg | INTRAMUSCULAR | Status: DC | PRN

## 2020-05-15 MED ORDER — COCONUT OIL OIL: 1.0000 "application " | TOPICAL_OIL | Status: DC | PRN

## 2020-05-15 MED ORDER — DIPHENHYDRAMINE HCL 50 MG/ML IJ SOLN: 12.5000 mg | INTRAMUSCULAR | Status: DC | PRN

## 2020-05-15 NOTE — Op Note (Addendum)
Operative Note   SURGERY DATE: 05/15/2020  PRE-OP DIAGNOSIS:  *Pregnancy at [redacted]w[redacted]d *History of one prior cesarean section *Fetal Bradycardia/non-reassuring fetal assessment   POST-OP DIAGNOSIS: As above, also uterine abruption    PROCEDURE: repeat low transverse cesarean section via pfannenstiel skin incision with single layer uterine closure  SURGEON: Surgeon(s) and Role:    * Myna Hidalgo, DO - Primary    * Anyanwu, Jethro Bastos, MD - Assisting    * Gita Kudo, MD - Fellow  ANESTHESIA: epidural  ESTIMATED BLOOD LOSS:  1000  DRAINS: UOP via indwelling foley  TOTAL IV FLUIDS: crystalloid  VTE PROPHYLAXIS: SCDs to bilateral lower extremities  ANTIBIOTICS: Two grams of Cefazolin were given., within 1 hour of skin incision  SPECIMENS: placenta to pathology   COMPLICATIONS: hysterotomy extension  INDICATIONS: Fetal bradycardia/non-reassuring fetal status, history of one prior cesarean section   FINDINGS: Mild intra-abdominal adhesions were noted. Right sided broad ligament extension, grossly normal ovaries and fallopian tubes. Clear amniotic fluid, cephalic female infant, weight pending , APGARs 2/7, intact placenta.   PROCEDURE IN DETAIL:  Due to the emergency nature of the situation, a full written consent was not obtained. Patient was counseled on risks/benefits of repeat cesarean section en route to the OR.   The patient was urgently taken to the the operating room her epidural anesthesia was dosed up to surgical level and was found to be adequate. She was then placed in a dorsal supine position with a leftward tilt, prepped quickly with betadine and draped in a sterile manner.  She already had a foley catheter in her bladder from L&D.    After a timeout was performed, a Pfannenstiel skin incision was made with scalpel and carried through to the underlying layer of fascia. The fascial incision was extended bilaterally in a blunt fashion.  The fascia was  separated from underlying rectus muscles bluntly and sharply with mayo scissors.The rectus muscles were separated in the midline bluntly and the peritoneum was entered bluntly.   A low transverse hysterotomy was made with a scalpel and extended bilaterally bluntly.  The infant was successfully delivered, the cord was clamped and cut and the infant was handed over to awaiting neonatology team. Cord gas obtained. Incision to delivery time was less than two minutes. The placenta was delivered intact and had a three-vessel cord. Alexis retractor was inserted. The uterus was then cleared of clot and debris.  At that that time the hysterotomy extension was noted.  The hysterotomy was closed with 0 Vicryl in a running locked fashion.  At this time, Dr. Charlotta Newton called emergently to assess another patient. Given stability of patient, area of broad ligament extension was packed while awaiting on call attending's arrival. After approximately fifteen minutes, Dr. Macon Large scrubbed in to complete case. At this time pressure was removed and site of extension was reassessed. Several sutures of 0 vicryl were placed to control bleeding. A single O'Leary stitch was placed. The uterus was reinspected and noted to be hemostatic. A strip of surgicel was placed over hysterotomy site. The Alexis retractor was removed.   The fascia was then closed using 0 Vicryl in a running fashion.  The subcutaneous layer was irrigated and the skin was closed with staples. The patient tolerated the procedure well. Sponge, instrument and needle counts were correct x 3.  She was taken to the recovery room in stable condition.    Casper Harrison, MD [facility] Family Medicine Fellow, [facility] for [employer],  [facility]  Attestation of Attending Supervision of Obstetric Fellow: Evaluation and management procedures were performed by the Obstetric Fellow under my supervision and collaboration.  I have reviewed the Obstetric  Fellow's note and chart, and I agree with the management and plan. I have also made any necessary editorial changes.   Sharon Seller, DO Attending Obstetrician & Gynecologist, [facility] for [facility], [facility] 05/15/2020 7:51 AM

## 2020-05-15 NOTE — Progress Notes (Signed)
Patient in NICU; unable to obtain vital signs and give pain mediciation at this time.

## 2020-05-15 NOTE — Discharge Summary (Incomplete)
   Postpartum Discharge Summary  Date of Service updated***     Patient Name: Tammy [name] DOB: 05/21/1995 MRN: [id number]  Date of admission: 05/14/2020 Delivery date:05/14/2020  Delivering provider: OZAN, JENNIFER  Date of discharge: 05/15/2020  Admitting diagnosis: Indication for care in labor or delivery [O75.9] Intrauterine pregnancy: [redacted]w[redacted]d     Secondary diagnosis:  Active Problems:   Gestational hypertension   Uterine rupture   Cesarean delivery delivered  Additional problems: ***    Discharge diagnosis: Term Pregnancy Delivered, Gestational Hypertension and PPH                                              Post partum procedures:{Postpartum procedures:23558} Augmentation: Pitocin Complications: Uterine Rupture  Hospital course: Induction of Labor With Cesarean Section   25 y.o. yo G5P3114 at [redacted]w[redacted]d was admitted to the hospital 05/14/2020 for induction of labor. Patient had a labor course significant for being started on pitocin. She progressed to complete and +1 and subsequently delivered fetal bradcardia. The patient went for cesarean section due to Non-Reassuring FHR. Delivery details are as follows: Membrane Rupture Time/Date: 10:07 PM ,05/14/2020   Delivery Method:C-Section, Low Transverse  Details of operation can be found in separate operative Note. Patient had a uterine abruption.   Patient had an uncomplicated postpartum course. She is ambulating, tolerating a regular diet, passing flatus, and urinating well.  Patient is discharged home in stable condition on 05/15/20.      Newborn Data: Birth date:05/14/2020  Birth time:11:35 PM  Gender:Female  Living status:Living  Apgars:2 ,7  Weight:2500 g                                 Magnesium Sulfate received: {Mag received:30440022} BMZ received: {BMZ received:30440023} Rhophylac:{Rhophylac received:30440032} MMR:{MMR:30440033} T-DaP:{Tdap:23962} Flu: {Flu:23963} Transfusion:{Transfusion  received:30440034}  Physical exam  Vitals:   05/15/20 0124 05/15/20 0130 05/15/20 0145 05/15/20 0200  BP: 112/63 111/71 123/76 127/70  Pulse: 66 62 62 85  Resp: 12 12 12 14  Temp: 97.8 F (36.6 C)  97.7 F (36.5 C)   TempSrc: Oral     SpO2: 96% 100% 100% 100%  Weight:      Height:       General: {Exam; general:21111117} Lochia: {Desc; appropriate/inappropriate:30686::"appropriate"} Uterine Fundus: {Desc; firm/soft:30687} Incision: {Exam; incision:21111123} DVT Evaluation: {Exam; dvt:2111122} Labs: Lab Results  Component Value Date   WBC 8.2 05/14/2020   HGB 11.0 (L) 05/14/2020   HCT 34.1 (L) 05/14/2020   MCV 85.5 05/14/2020   PLT 241 05/14/2020   CMP Latest Ref Rng & Units 05/14/2020  Glucose 70 - 99 mg/dL 69(L)  BUN 6 - 20 mg/dL 5(L)  Creatinine 0.44 - 1.00 mg/dL 0.74  Sodium 135 - 145 mmol/L 136  Potassium 3.5 - 5.1 mmol/L 3.8  Chloride 98 - 111 mmol/L 106  CO2 22 - 32 mmol/L 24  Calcium 8.9 - 10.3 mg/dL 8.4(L)  Total Protein 6.5 - 8.1 g/dL 5.7(L)  Total Bilirubin 0.3 - 1.2 mg/dL 0.6  Alkaline Phos 38 - 126 U/L 186(H)  AST 15 - 41 U/L 20  ALT 0 - 44 U/L 14   Edinburgh Score: No flowsheet data found.   After visit meds:  Allergies as of 05/15/2020      Reactions   Pollen Extract Swelling      Med Rec must be completed prior to using this [facility]***        Discharge home in stable condition Infant Feeding: {Baby feeding:23562} Infant Disposition:{CHL IP OB HOME WITH MHDQQI:29798} Discharge instruction: per After Visit Summary and Postpartum booklet. Activity: Advance as tolerated. Pelvic rest for 6 weeks.  Diet: {OB XQJJ:94174081} Future Appointments: Future Appointments  Date Time Provider [place]  05/19/2020 10:15 AM Shelly Bombard, MD [place] None   Follow up Visit:   Please schedule this patient for a {Visit type:23955} postpartum visit in {Postpartum visit:23953} with the following provider: {Provider type:23954}. Additional  Postpartum F/U:{PP Procedure:23957}  {Risk KGYJE:56314} pregnancy complicated by: {HFWYOVZCHYIF:02774} Delivery mode:  C-Section, Low Transverse  Anticipated Birth Control:  {Birth Control:23956}   05/15/2020 Janet Berlin, MD

## 2020-05-15 NOTE — Transfer of Care (Signed)
Immediate Anesthesia Transfer of Care Note  Patient: Tammy [name]  Procedure(s) Performed: CESAREAN SECTION (N/A )  Patient Location: PACU  Anesthesia Type:Epidural  Level of Consciousness: awake, alert  and oriented  Airway & Oxygen Therapy: Patient Spontanous Breathing  Post-op Assessment: Report given to RN and Post -op Vital signs reviewed and stable  Post vital signs: Reviewed and stable  Last Vitals:  Vitals Value Taken Time  BP 112/68 05/15/20 0122  Temp    Pulse 64 05/15/20 0122  Resp 12 05/15/20 0122  SpO2 93 % 05/15/20 0122  Vitals shown include unvalidated device data.  Last Pain:  Vitals:   05/14/20 2230  TempSrc:   PainSc: 6          Complications: No complications documented.

## 2020-05-15 NOTE — Addendum Note (Signed)
Addendum  created 05/15/20 0853 by Armanda Heritage, CRNA   Clinical Note Signed

## 2020-05-15 NOTE — Lactation Note (Signed)
This note was copied from a baby's chart. Lactation Consultation Note Mom doesn't plan to BF she is formula feeding only. Declines Lactation.  Patient Name: Tammy [name] [id number] Date: 05/15/2020   Age:25 hours  Maternal Data    Feeding    LATCH Score                    Lactation Tools Discussed/Used    Interventions    Discharge    Consult Status Consult Status: Complete Date: 05/15/20    Charyl Dancer 05/15/2020, 6:05 AM

## 2020-05-15 NOTE — Progress Notes (Signed)
Patient's pressure dressing has shadowing covering over half of dressing and there is sanguinous drainage coming out of the bottom of the dressing.  Resident updated; verbal order received to change honeycomb.  Upon removing old honeycomb, no new drainage at incision site noted.  Will continue to monitor. Two RN's were present to change dressing and sterile gloves were used.

## 2020-05-15 NOTE — Progress Notes (Signed)
Dr. Germaine Pomfret updated regarding patient's incision draining; received a verbal order to change honeycomb and add a pressure dressing; this was done by 2 rn's; sterile gloves used to place honeycomb.

## 2020-05-15 NOTE — Discharge Summary (Signed)
Postpartum Discharge Summary    Patient Name: Tammy [name] DOB: November 18, 1995 MRN: [id number]  Date of admission: 05/14/2020 Delivery date:05/14/2020  Delivering provider: Janyth Pupa  Date of discharge: 05/17/2020  Admitting diagnosis: Indication for care in labor or delivery [O75.9] Intrauterine pregnancy: [redacted]w[redacted]d    Secondary diagnosis:  Active Problems:   Asthma   History of preterm delivery, currently pregnant   History of prior pregnancy with IUGR newborn   History of inadequate prenatal care   Group B Streptococcus carrier, +RV culture, currently pregnant   Gestational hypertension   Uterine rupture   Cesarean delivery delivered   [place](postpartum hemorrhage)  Additional problems: none    Discharge diagnosis: Term Pregnancy Delivered, Gestational Hypertension, Anemia and PPH                Post partum procedures:n/a Augmentation: Pitocin Complications: [id number] Uterine Rupture  Hospital course: Induction of Labor With Cesarean Section   25y.o. yo [id number] admitted to the hospital 05/14/2020 for induction of labor. Patient had a labor course significant for IOL with pitocin, which was titrated. Patient had SROM.  Due to fetal bradycardia and concern for uterine rupture patient was brought urgently to the OR for stat cesarean section. The patient went for cesarean section due to Non-Reassuring FHR. Please refer to op note for further details. Delivery details are as follows: Membrane Rupture Time/Date: 10:07 PM ,05/14/2020   Delivery Method:C-Section, Low Transverse  Details of operation can be found in separate operative Note.  Patient had an uncomplicated postpartum course. She is ambulating, tolerating a regular diet, passing flatus, and urinating well.  Patient is discharged home in stable condition on 05/17/20.      Newborn Data: Birth date:05/14/2020  Birth time:11:35 PM  Gender:Female  Living status:Living  Apgars:2 ,7  Weight:2500 g                                  Magnesium Sulfate received: No BMZ received: No Rhophylac:N/A MMR:N/A T-DaP:offered prior to discharge Flu: offered prior to discharge Transfusion:No  Physical exam  Vitals:   05/16/20 1244 05/16/20 1609 05/16/20 2243 05/17/20 0637  BP: 115/68 (!) 112/56 (!) 125/58 120/75  Pulse: 67 73 79 74  Resp: '18 18 14 15  ' Temp: 98.4 F (36.9 C) 97.6 F (36.4 C) 98.1 F (36.7 C) 97.7 F (36.5 C)  TempSrc: Oral Oral Oral Oral  SpO2: 100% 100% 100% 100%  Weight:      Height:       General: alert, cooperative and no distress Lochia: appropriate Uterine Fundus: firm Incision: Healing well with no significant drainage, No significant erythema, Dressing is clean, dry, and intact DVT Evaluation: No evidence of DVT seen on physical exam. No cords or calf tenderness. No significant calf/ankle edema. Labs: Lab Results  Component Value Date   WBC 19.2 (H) 05/15/2020   HGB 9.5 (L) 05/15/2020   HCT 29.7 (L) 05/15/2020   MCV 86.3 05/15/2020   PLT 239 05/15/2020   CMP Latest Ref Rng & Units 05/15/2020  Glucose 70 - 99 mg/dL -  BUN 6 - 20 mg/dL -  Creatinine 0.44 - 1.00 mg/dL 0.77  Sodium 135 - 145 mmol/L -  Potassium 3.5 - 5.1 mmol/L -  Chloride 98 - 111 mmol/L -  CO2 22 - 32 mmol/L -  Calcium 8.9 - 10.3 mg/dL -  Total Protein 6.5 -  8.1 g/dL -  Total Bilirubin 0.3 - 1.2 mg/dL -  Alkaline Phos 38 - 126 U/L -  AST 15 - 41 U/L -  ALT 0 - 44 U/L -   Edinburgh Score: Edinburgh Postnatal Depression Scale Screening Tool 05/16/2020  I have been able to laugh and see the funny side of things. (No Data)     After visit meds:  Allergies as of 05/17/2020      Reactions   Pollen Extract Swelling      Medication List    STOP taking these medications   aspirin 81 MG chewable tablet   Blood Pressure Kit Devi   HYDROXYprogesterone caproate autoinjector Commonly known as: Makena     TAKE these medications   acetaminophen 500 MG tablet Commonly known as:  TYLENOL Take 2 tablets (1,000 mg total) by mouth every 6 (six) hours.   albuterol 108 (90 Base) MCG/ACT inhaler Commonly known as: VENTOLIN HFA Inhale 2 puffs into the lungs every 4 (four) hours as needed for wheezing or shortness of breath.   Budesonide 90 MCG/ACT inhaler Inhale 2 puffs into the lungs 2 (two) times daily.   coconut oil Oil Apply 1 application topically as needed (nipple pain).   ferrous sulfate 325 (65 FE) MG tablet Take 1 tablet (325 mg total) by mouth every other day.   ibuprofen 600 MG tablet Commonly known as: ADVIL Take 1 tablet (600 mg total) by mouth every 6 (six) hours.   oxyCODONE 5 MG immediate release tablet Commonly known as: Oxy IR/ROXICODONE Take 1-2 tablets (5-10 mg total) by mouth every 6 (six) hours as needed for severe pain or breakthrough pain.   Vitafol Gummies 3.33-0.333-34.8 MG Chew Chew 3 tablets by mouth daily.        Discharge home in stable condition Infant Feeding: Bottle Infant Disposition:home with mother Discharge instruction: per After Visit Summary and Postpartum booklet. Activity: Advance as tolerated. Pelvic rest for 6 weeks.  Diet: routine diet Future Appointments: Future Appointments  Date Time Provider [place]  05/22/2020  1:40 PM [place] None  06/23/2020  3:15 PM Sloan Leiter, MD [place] None   Follow up Visit:  [place] Follow up.   Why: in 4 weeks for a postpartum appointment Contact information: [address] [place] [address] [phone]             Message sent to [facility] 05/15/20 by Sylvester Harder.  Please schedule this patient for a In person postpartum visit in 6 weeks with the following provider: Any provider. Additional Postpartum F/U:Incision check 1 week and BP check 1 week  High risk pregnancy complicated by: HTN Delivery mode:  C-Section, Low Transverse  Anticipated Birth Control:  Depo, given  prior to discharge   05/17/2020 Wende Mott, CNM

## 2020-05-15 NOTE — Anesthesia Postprocedure Evaluation (Signed)
Anesthesia Post Note  Patient: Tammy [name]  Procedure(s) Performed: CESAREAN SECTION (N/A )     Patient location during evaluation: Mother Baby Anesthesia Type: Epidural Level of consciousness: oriented and awake and alert Pain management: pain level controlled Vital Signs Assessment: post-procedure vital signs reviewed and stable Respiratory status: spontaneous breathing and respiratory function stable Cardiovascular status: blood pressure returned to baseline and stable Postop Assessment: no headache, no backache, no apparent nausea or vomiting and able to ambulate Anesthetic complications: no   No complications documented.  Last Vitals:  Vitals:   05/15/20 0200 05/15/20 0215  BP: 127/70 129/77  Pulse: 85 61  Resp: 14 10  Temp:    SpO2: 100% 98%    Last Pain:  Vitals:   05/15/20 0203  TempSrc:   PainSc: 4    Pain Goal:                Epidural/Spinal Function Cutaneous sensation: Tingles (05/15/20 0215), Patient able to flex knees: Yes (05/15/20 0215), Patient able to lift hips off bed: Yes (05/15/20 0215), Back pain beyond tenderness at insertion site: No (05/15/20 0215), Progressively worsening motor and/or sensory loss: No (05/15/20 0215), Bowel and/or bladder incontinence post epidural: No (05/15/20 0215)  Trevor Iha

## 2020-05-15 NOTE — Discharge Instructions (Signed)

## 2020-05-15 NOTE — Progress Notes (Signed)
POSTPARTUM PROGRESS NOTE  Subjective: Tammy [name] is a 25 y.o. [id number] s/p stat repeat LTCS at [redacted]w[redacted]d. Patient very tired this AM on assessment. Asking about when she can go home. Says baby doing well in NICU. She reports she doing well. No acute events overnight. She has not yet stood at bedside. Has foley in place still. She has passed flatus. Pain is well controlled.  Lochia is mild.  Objective: Blood pressure 125/65, pulse 73, temperature 98 F (36.7 C), temperature source Oral, resp. rate 16, height 5\' 3"  (1.6 m), weight 64 kg, last menstrual period 08/13/2019, SpO2 100 %, unknown if currently breastfeeding.  Physical Exam:  General: alert, cooperative and no distress Chest: no respiratory distress Abdomen: soft, appropriately-tender  Uterine Fundus: firm and at level of umbilicus, pressure dressing place with serosang drainage  Extremities: No calf swelling or tenderness  no edema  Recent Labs    05/14/20 1937 05/15/20 0721  HGB 11.0* 9.5*  HCT 34.1* 29.7*    Assessment/Plan: Tammy [name] is a 25 y.o. 22 s/p stat rLTCS at [redacted]w[redacted]d for fetal bradycardia. Baby in NICU.  Routine Postpartum Care: Discussed labor course and stat c section. Overall doing well, pain well-controlled.  -- Continue routine care, lactation support --Acute blood loss anemia: start PO iron --Staples in place: Remove prior to DC  -- +THC, social work consult ordered -- Gestational HTN: montitor BP, currently normotensive     -- Contraception: depo -- Feeding: bottle  Dispo: Plan for discharge POD#2-3 .  [redacted]w[redacted]d, MD OB Fellow, [facility] 05/15/2020 8:51 AM

## 2020-05-15 NOTE — Anesthesia Postprocedure Evaluation (Signed)
Anesthesia Post Note  Patient: ZAKIAH GAUTHREAUX  Procedure(s) Performed: CESAREAN SECTION (N/A )     Patient location during evaluation: Mother Baby Anesthesia Type: Epidural Level of consciousness: awake and alert Pain management: pain level controlled Vital Signs Assessment: post-procedure vital signs reviewed and stable Respiratory status: spontaneous breathing Cardiovascular status: stable Postop Assessment: no headache, adequate PO intake, no backache, able to ambulate, patient able to bend at knees, epidural receding and no apparent nausea or vomiting Anesthetic complications: no   No complications documented.  Last Vitals:  Vitals:   05/15/20 0300 05/15/20 0425  BP: 129/69 125/65  Pulse: 62 73  Resp: 12 16  Temp: (!) 36.4 C 36.7 C  SpO2: 98% 100%    Last Pain:  Vitals:   05/15/20 0745  TempSrc:   PainSc: 4    Pain Goal: Patients Stated Pain Goal: 0 (05/15/20 0300)                 Salome Arnt

## 2020-05-15 NOTE — Clinical Social Work Maternal (Signed)
CLINICAL SOCIAL WORK MATERNAL/CHILD NOTE  Patient Details  Name: Tammy [name] MRN: [id number] Date of Birth: [birth year]  Date:  05/15/2020  Clinical Social Worker Initiating Note:  Darcus Austin, MSW, LCSWA Date/Time: Initiated:  05/15/20/1455     Child's Name:  Noah Delaine   Biological Parents:  Mother,Father Micheline [place], [birth year] 1991, [phone])   Need for Interpreter:  None   Reason for Referral:  Current Substance Use/Substance Use During Pregnancy    Address:  [place] [address]    Phone number:  [phone] (home)     Additional phone number:   Household Members/Support Persons (HM/SP):   Household Member/Support Person 1,Household Member/Support Person 2,Household Member/Support Person 3   HM/SP Name Relationship DOB or Age  HM/SP -[place] Son 01/11/11  HM/SP -[place] Son 11/23/12  HM/SP -3 [address] [place] Daughter 08/22/17  HM/SP -4        HM/SP -5        HM/SP -6        HM/SP -7        HM/SP -8          Natural Supports (not living in the home):  Immediate Family,Spouse/significant other   Professional Supports:     Employment: Unemployed   Type of Work:     Education:  Programmer, systems   Homebound arranged:    Museum/gallery curator Resources:  Medicaid   Other Resources:  [place] Considerations Which May Impact Care:    Strengths:  Ability to meet basic needs ,Home prepared for child ,Pediatrician chosen   Psychotropic Medications:         Pediatrician:    [place]  Pediatrician List:   [facility] [place]      Pediatrician Fax Number:    Risk Factors/Current Problems:  Substance Use    Cognitive State:  Able to Concentrate ,Alert ,Goal Oriented ,Insightful ,Linear Thinking    Mood/Affect:  Comfortable ,Relaxed ,Calm  ,Interested    CSW Assessment: CSW met with [name] to complete assessment for hx of THC. CSW observed [name] resting in bed while FOB was resting on couch. [name] gave CSW verbal consent to complete assessment while FOB was present. CSW explained role and reason for consult. [name] was pleasant, polite and engaged with CSW.  [name] was not a honesty historian about last use of substance. [name] reported, her last use of THC was in Oct 2021. However, CSW informed [name] of positive UDS in Dec 2021.   CSW informed [name] of Drug Screen Policy and [name] was understanding of protocol.CSW will continue to follow the UDS/CDS and will make [facility] CPS report if warranted. [name] denied any other use of substance or CPS involvement.    [name] identified, FOB and [name]'s father as her supports. When CSW asked [name] about her emotions and infant's NICU admission. [name] reported, she is currently feeling okay. [name] denied SI and HI when CSW assessed for safety. [name] denied any additional barriers.    CSW provided education regarding the baby blues period vs. perinatal mood disorders, discussed treatment and gave resources for mental health follow up if concerns arise. CSW recommends self- evaluation during the postpartum time period using the New Mom Checklist from Postpartum Progress and encouraged [name] to contact a medical professional if  symptoms are noted at any time.   [name] reported, she received WIC/FS. CSW informed [name] about adding infant to WIC/FS.   [name] reported, infant's pediatrician will be at [facility] and there may be transportation issues to follow up care. CSW educated [name] about [employer] and contacting medicaid regarding transportation. [name] reported, she has all essentials needed to care for infant. [name] reported, infant has a new car seat and FOB plan to purchase crib prior to D/C. [name] and FOB is aware to follow up with CSW if any challenges occur.      CSW provided education on Sudden Infant Death  Syndrome (SIDS).    CSW will continue to follow up with [name] for support and offer resources.   CSW Plan/Description:  CSW Will Continue to Monitor Umbilical Cord Tissue Drug Screen Results and Make Report if [facility] Drug Screen Policy Information,Perinatal Mood and Anxiety Disorder (PMADs) Education,Sudden Infant Death Syndrome (SIDS) Education,Psychosocial Support and Ongoing Assessment of Needs    Darcus Austin, [place] 05/15/2020, 3:34 PM

## 2020-05-16 LAB — SURGICAL PATHOLOGY

## 2020-05-16 NOTE — Progress Notes (Signed)
Post-Op Day 2 (although <36 hours), repeat CS for bradycardia  Subjective: No complaints, up ad lib, voiding and tolerating PO, passing flatus,small lochia,.plans to breastfeed, plans to bottle feed, Depo-Provera  Objective: Blood pressure 104/66, pulse 65, temperature 98.3 F (36.8 C), temperature source Oral, resp. rate 18, height 5\' 3"  (1.6 m), weight 64 kg, last menstrual period 08/13/2019, SpO2 100 %, unknown if currently breastfeeding.  Physical Exam:  General: alert, cooperative and no distress Lochia:normal flow Chest: CTAB Heart: RRR no m/r/g Abdomen: +BS, soft, nontender, dsg bloody from a misplaced peripad, will change, no erythema Uterine Fundus: firm DVT Evaluation: No evidence of DVT seen on physical exam. Extremities: no edema  Recent Labs    05/14/20 1937 05/15/20 0721  HGB 11.0* 9.5*  HCT 34.1* 29.7*    Assessment/Plan: Plan for discharge tomorrow, baby in NICU, doing well   LOS: 2 days   05/17/20 05/16/2020, 7:25 AM

## 2020-05-17 MED ORDER — COCONUT OIL OIL: 1.0000 "application " | TOPICAL_OIL | 0 refills | Status: AC | PRN

## 2020-05-17 MED ORDER — FERROUS SULFATE 325 (65 FE) MG PO TABS: 325.0000 mg | ORAL_TABLET | ORAL | 2 refills | Status: AC

## 2020-05-17 MED ORDER — ACETAMINOPHEN 500 MG PO TABS: 1000.0000 mg | ORAL_TABLET | Freq: Four times a day (QID) | ORAL | 0 refills | Status: AC

## 2020-05-17 MED ORDER — IBUPROFEN 600 MG PO TABS: 600.0000 mg | ORAL_TABLET | Freq: Four times a day (QID) | ORAL | 0 refills | Status: AC

## 2020-05-17 MED ORDER — OXYCODONE HCL 5 MG PO TABS: 5.0000 mg | ORAL_TABLET | Freq: Four times a day (QID) | ORAL | 0 refills | Status: AC | PRN

## 2020-05-17 NOTE — Progress Notes (Signed)
CSW acknowledges consult for edinburgh score 13. [name] was seen by CSW (S. Knight) on 05/15/2020 and CSW provided education regarding the baby blues period vs. perinatal mood disorders, discussed treatment and gave resources for mental health follow up if concerns arise.  CSW recommends self-evaluation during the postpartum time period using the New Mom Checklist from Postpartum Progress and encouraged [name] to contact a medical professional if symptoms are noted at any time.     CSW identifies no further need for intervention and no barriers to discharge at this time.  Tammy Pauling, LCSW Clinical Social Worker [facility] Cell#: [phone] 

## 2020-05-17 NOTE — Plan of Care (Signed)
Discharge instructions given with after visit summary, pt receptive.  

## 2020-05-19 ENCOUNTER — Encounter: Payer: Medicaid Other | Admitting: Obstetrics

## 2020-05-22 ENCOUNTER — Ambulatory Visit (INDEPENDENT_AMBULATORY_CARE_PROVIDER_SITE_OTHER): Payer: Medicaid Other

## 2020-05-22 ENCOUNTER — Other Ambulatory Visit: Payer: Self-pay

## 2020-05-22 VITALS — BP 133/86 | HR 72

## 2020-05-22 DIAGNOSIS — Z0130 Encounter for examination of blood pressure without abnormal findings: Secondary | ICD-10-CM

## 2020-05-22 DIAGNOSIS — Z4889 Encounter for other specified surgical aftercare: Secondary | ICD-10-CM

## 2020-05-22 NOTE — Progress Notes (Addendum)
Subjective:     Tammy [name] is a 25 y.o. female who presents to the clinic  One week status post cesarean section. Eating a regular diet with-out difficulty. Bowel movements are normal. The patient is not having any abnormal pain.    Review of Systems   Objective:    BP 133/86   Pulse 72   LMP 08/13/2019 (Exact Date)  General:  Well appearance   Abdomen: {post op abd exam:"soft","bowel sounds active","non-tender  Incision:   {incision::"no dehiscence","incision well approximated","healing well","no drainage","no erythema","no hernia","no seroma","no swelling     Assessment:    Doing well   Blood Pressure is well controlled at this time.  Plan:    1. Continue any current medications. 2. Wound care discussed. 3. Activity restrictions: As tolerated 4. Anticipated return to work: After postpartum. 5. Follow up: 06/23/2020

## 2020-06-23 ENCOUNTER — Ambulatory Visit: Payer: Medicaid Other | Admitting: Obstetrics and Gynecology

## 2021-01-04 ENCOUNTER — Emergency Department (HOSPITAL_COMMUNITY)
Admission: EM | Admit: 2021-01-04 | Discharge: 2021-01-04 | Disposition: A | Discharge status: Home / Self Care | Payer: Medicaid Other | Attending: Emergency Medicine | Admitting: Emergency Medicine

## 2021-01-04 ENCOUNTER — Encounter (HOSPITAL_COMMUNITY): Payer: Self-pay

## 2021-01-04 DIAGNOSIS — M542 Cervicalgia: Secondary | ICD-10-CM | POA: Diagnosis not present

## 2021-01-04 DIAGNOSIS — M545 Low back pain, unspecified: Secondary | ICD-10-CM | POA: Diagnosis not present

## 2021-01-04 DIAGNOSIS — S61411A Laceration without foreign body of right hand, initial encounter: Secondary | ICD-10-CM | POA: Insufficient documentation

## 2021-01-04 DIAGNOSIS — Z5321 Procedure and treatment not carried out due to patient leaving prior to being seen by health care provider: Secondary | ICD-10-CM | POA: Diagnosis not present

## 2021-01-04 DIAGNOSIS — S6991XA Unspecified injury of right wrist, hand and finger(s), initial encounter: Secondary | ICD-10-CM | POA: Diagnosis present
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
## 2021-05-01 ENCOUNTER — Emergency Department (HOSPITAL_COMMUNITY)
Admission: EM | Admit: 2021-05-01 | Discharge: 2021-05-01 | Discharge status: Against Medical Advice | Payer: Medicaid Other

## 2021-05-01 NOTE — ED Notes (Signed)
Pt called 3x no answer  

## 2021-05-19 ENCOUNTER — Encounter: Payer: Self-pay | Admitting: Emergency Medicine

## 2021-05-19 ENCOUNTER — Ambulatory Visit
Admission: EM | Admit: 2021-05-19 | Discharge: 2021-05-19 | Disposition: A | Discharge status: Home / Self Care | Payer: Medicaid Other | Attending: Internal Medicine | Admitting: Internal Medicine

## 2021-05-19 DIAGNOSIS — J4521 Mild intermittent asthma with (acute) exacerbation: Secondary | ICD-10-CM

## 2021-05-19 MED ORDER — PREDNISONE 10 MG PO TABS: 20.0000 mg | ORAL_TABLET | Freq: Every day | ORAL | 0 refills | Status: AC

## 2021-05-19 MED ORDER — ALBUTEROL SULFATE HFA 108 (90 BASE) MCG/ACT IN AERS
2.0000 | INHALATION_SPRAY | Freq: Once | RESPIRATORY_TRACT | Status: AC
  Administered 2021-05-19: 2 via RESPIRATORY_TRACT

## 2021-05-19 NOTE — ED Triage Notes (Signed)
Patient c/o her asthma flared up this morning, coughing and wheezing.  Patient out of her Albuterol inhaler. ?

## 2021-05-19 NOTE — Discharge Instructions (Signed)
You have been given an albuterol inhaler to take as needed as well as prednisone to steroid to decrease inflammation associated with your asthma attack.  Please follow-up with OB/GYN given your recent positive pregnancy test for further evaluation and management. ?

## 2021-05-19 NOTE — ED Provider Notes (Signed)
?[facility] ? ? ? ?CSN: [id number] ?Arrival date & time: 05/19/21  8502 ? ? ?  ? ?History   ?Chief Complaint ?Chief Complaint  ?Patient presents with  ? Wheezing  ? ? ?HPI ?Tammy [name] is a 26 y.o. female.  ? ?Patient presents today for further evaluation and management due to complaints of asthma attack.  Patient reports that she started having wheezing, shortness of breath, cough this morning upon awakening.  She attributes her asthma attack to sleeping with a fan.  Denies any other obvious asthma triggers.  Denies any other associated upper respiratory symptoms or fever.  She reports that she typically has her albuterol inhaler but she ran out of it.  Denies any associated chest pain, dizziness, headache, blurred vision. ? ?Patient also reported to nursing staff during triage that her last menstrual cycle was approximately 2 months ago  Patient reported that she took a pregnancy test at home that was positive as well.  She has not followed up with OB/GYN yet but reports that she desires an abortion so she is going to call a clinic soon. ? ? ?Wheezing ? ?Past Medical History:  ?Diagnosis Date  ? Arthritis   ? Asthma   ? used inhaler 05/14/20  ? Chlamydia infection   ? History of chicken pox   ? Preterm delivery   ? ? ?Patient Active Problem List  ? Diagnosis Date Noted  ? Uterine rupture 05/15/2020  ? Cesarean delivery delivered 05/15/2020  ? PPH (postpartum hemorrhage) 05/15/2020  ? Gestational hypertension 05/14/2020  ? Group B Streptococcus carrier, +RV culture, currently pregnant 04/29/2020  ? History of inadequate prenatal care 04/15/2020  ? History of cesarean delivery, currently pregnant 10/22/2019  ? History of prior pregnancy with IUGR newborn 01/31/2019  ? History of preterm delivery, currently pregnant   ? Asthma, severe persistent, poorly-controlled 06/22/2012  ? Asthma, chronic 06/21/2012  ? Asthma 11/05/2011  ? ? ?Past Surgical History:  ?Procedure Laterality Date  ? CESAREAN SECTION     ? CESAREAN SECTION N/A 05/14/2020  ? Procedure: CESAREAN SECTION;  Surgeon: Myna Hidalgo, DO;  Location: [facility] LD ORS;  Service: Obstetrics;  Laterality: N/A;  ? ? ?OB History   ? ? Gravida  ?5  ? Para  ?4  ? Term  ?3  ? Preterm  ?1  ? AB  ?1  ? Living  ?4  ?  ? ? SAB  ?1  ? IAB  ?   ? Ectopic  ?   ? Multiple  ?0  ? Live Births  ?4  ?   ?  ?  ? ? ? ?Home Medications   ? ?Prior to Admission medications   ?Medication Sig Start Date End Date Taking? Authorizing Provider  ?acetaminophen (TYLENOL) 500 MG tablet Take 2 tablets (1,000 mg total) by mouth every 6 (six) hours. 05/17/20  Yes Rolm Bookbinder, CNM  ?albuterol (VENTOLIN HFA) 108 (90 Base) MCG/ACT inhaler Inhale 2 puffs into the lungs every 4 (four) hours as needed for wheezing or shortness of breath. 03/10/20  Yes Rasch, Victorino Dike I, NP  ?predniSONE (DELTASONE) 10 MG tablet Take 2 tablets (20 mg total) by mouth daily for 5 days. 05/19/21 05/24/21 Yes Gustavus Bryant, FNP  ?Budesonide 90 MCG/ACT inhaler Inhale 2 puffs into the lungs 2 (two) times daily. 03/10/20   Rasch, Victorino Dike I, NP  ?coconut oil OIL Apply 1 application topically as needed (nipple pain). 05/17/20   Rolm Bookbinder, CNM  ?ferrous sulfate  325 (65 FE) MG tablet Take 1 tablet (325 mg total) by mouth every other day. 05/17/20   Rolm BookbinderNeill, Caroline M, CNM  ?ibuprofen (ADVIL) 600 MG tablet Take 1 tablet (600 mg total) by mouth every 6 (six) hours. 05/17/20   Rolm BookbinderNeill, Caroline M, CNM  ?oxyCODONE (OXY IR/ROXICODONE) 5 MG immediate release tablet Take 1-2 tablets (5-10 mg total) by mouth every 6 (six) hours as needed for severe pain or breakthrough pain. 05/17/20   Rolm BookbinderNeill, Caroline M, CNM  ?Prenatal Vit-Fe Phos-FA-Omega (VITAFOL GUMMIES) 3.33-0.333-34.8 MG CHEW Chew 3 tablets by mouth daily. 02/05/20   Brock BadHarper, Charles A, MD  ? ? ?Family History ?Family History  ?Problem Relation Age of Onset  ? Migraines Mother   ? Mitral valve prolapse Mother   ? Mitral valve prolapse Maternal Grandmother   ? Dementia Maternal  Grandmother   ? Kidney disease Paternal Grandmother   ?     dialysis  ? Cancer Paternal Grandfather   ?     Lung  ? Anemia Maternal Aunt   ? Mitral valve prolapse Maternal Aunt   ? Heart disease Paternal Uncle   ? ? ?Social History ?Social History  ? ?Tobacco Use  ? Smoking status: Former  ?  Packs/day: 0.25  ?  Types: Cigarettes  ? Smokeless tobacco: Never  ?Vaping Use  ? Vaping Use: Never used  ?Substance Use Topics  ? Alcohol use: Not Currently  ?  Comment: occ  ? Drug use: No  ? ? ? ?Allergies   ?Pollen extract ? ? ?Review of Systems ?Review of Systems ?Per HPI ? ?Physical Exam ?Triage Vital Signs ?ED Triage Vitals  ?Enc Vitals Group  ?   BP 05/19/21 0910 109/69  ?   Pulse Rate 05/19/21 0910 84  ?   Resp 05/19/21 0910 20  ?   Temp 05/19/21 0910 98.3 ?F (36.8 ?C)  ?   Temp Source 05/19/21 0910 Oral  ?   SpO2 05/19/21 0910 97 %  ?   Weight 05/19/21 0912 145 lb 1 oz (65.8 kg)  ?   Height 05/19/21 0912 5\' 3"  (1.6 m)  ?   Head Circumference --   ?   Peak Flow --   ?   Pain Score 05/19/21 0912 8  ?   Pain Loc --   ?   Pain Edu? --   ?   Excl. in GC? --   ? ?No data found. ? ?Updated Vital Signs ?BP 109/69 (BP Location: Right Arm)   Pulse 84   Temp 98.3 ?F (36.8 ?C) (Oral)   Resp 20   Ht 5\' 3"  (1.6 m)   Wt 145 lb 1 oz (65.8 kg)   SpO2 97%   BMI 25.70 kg/m?  ? ?Visual Acuity ?Right Eye Distance:   ?Left Eye Distance:   ?Bilateral Distance:   ? ?Right Eye Near:   ?Left Eye Near:    ?Bilateral Near:    ? ?Physical Exam ?Constitutional:   ?   General: She is not in acute distress. ?   Appearance: Normal appearance. She is not toxic-appearing or diaphoretic.  ?HENT:  ?   Head: Normocephalic and atraumatic.  ?   Mouth/Throat:  ?   Mouth: Mucous membranes are moist.  ?   Pharynx: No posterior oropharyngeal erythema.  ?Eyes:  ?   Extraocular Movements: Extraocular movements intact.  ?   Conjunctiva/sclera: Conjunctivae normal.  ?Cardiovascular:  ?   Rate and Rhythm: Normal rate and regular rhythm.  ?  Pulses: Normal  pulses.  ?   Heart sounds: Normal heart sounds.  ?Pulmonary:  ?   Effort: Pulmonary effort is normal. No respiratory distress.  ?   Breath sounds: Wheezing present.  ?   Comments: Mild wheezing noted on exam ?Neurological:  ?   General: No focal deficit present.  ?   Mental Status: She is alert and oriented to person, place, and time. Mental status is at baseline.  ?Psychiatric:     ?   Mood and Affect: Mood normal.     ?   Behavior: Behavior normal.     ?   Thought Content: Thought content normal.     ?   Judgment: Judgment normal.  ? ? ? ?UC Treatments / Results  ?Labs ?(all labs ordered are listed, but only abnormal results are displayed) ?Labs Reviewed - No data to display ? ?EKG ? ? ?Radiology ?No results found. ? ?Procedures ?Procedures (including critical care time) ? ?Medications Ordered in UC ?Medications  ?albuterol (VENTOLIN HFA) 108 (90 Base) MCG/ACT inhaler 2 puff (2 puffs Inhalation Given 05/19/21 0926)  ? ? ?Initial Impression / Assessment and Plan / UC Course  ?I have reviewed the triage vital signs and the nursing notes. ? ?Pertinent labs & imaging results that were available during my care of the patient were reviewed by me and considered in my medical decision making (see chart for details). ? ?  ? ?Symptoms and patient's history is consistent with mild asthma exacerbation.  Patient reported that her last menstrual cycle was approximately 2 months ago when she took a positive pregnancy test at home.  Albuterol inhaler was administered in urgent care today and patient was sent home with inhaler.  Prednisone was prescribed at low-dose and short course as well.  Patient has previously taken these while pregnant in the past and this should be safe with pregnancy at low-dose as well.  Advised to follow-up with OB/GYN given current pregnancy status as well.  Discussed return and ER precautions.  Patient verbalized understanding and was agreeable with plan. ?Final Clinical Impressions(s) / UC Diagnoses   ? ?Final diagnoses:  ?Mild intermittent asthma with acute exacerbation  ? ? ? ?Discharge Instructions   ? ?  ?You have been given an albuterol inhaler to take as needed as well as prednisone to steroid to decrease inflamma

## 2021-07-23 ENCOUNTER — Encounter (HOSPITAL_COMMUNITY): Payer: Self-pay

## 2021-07-23 ENCOUNTER — Ambulatory Visit (HOSPITAL_COMMUNITY)
Admission: EM | Admit: 2021-07-23 | Discharge: 2021-07-23 | Disposition: A | Discharge status: Home / Self Care | Payer: Medicaid Other | Attending: Physician Assistant | Admitting: Physician Assistant

## 2021-07-23 DIAGNOSIS — J4541 Moderate persistent asthma with (acute) exacerbation: Secondary | ICD-10-CM | POA: Diagnosis not present

## 2021-07-23 DIAGNOSIS — J45909 Unspecified asthma, uncomplicated: Secondary | ICD-10-CM

## 2021-07-23 MED ORDER — PREDNISONE 10 MG (21) PO TBPK: ORAL_TABLET | Freq: Every day | ORAL | 0 refills | Status: AC

## 2021-07-23 MED ORDER — ALBUTEROL SULFATE HFA 108 (90 BASE) MCG/ACT IN AERS
INHALATION_SPRAY | RESPIRATORY_TRACT | Status: AC
  Filled 2021-07-23: qty 6.7

## 2021-07-23 MED ORDER — IPRATROPIUM-ALBUTEROL 0.5-2.5 (3) MG/3ML IN SOLN
RESPIRATORY_TRACT | Status: AC
  Filled 2021-07-23: qty 3

## 2021-07-23 MED ORDER — ALBUTEROL SULFATE HFA 108 (90 BASE) MCG/ACT IN AERS: 2.0000 | INHALATION_SPRAY | RESPIRATORY_TRACT | 3 refills | Status: AC | PRN

## 2021-07-23 MED ORDER — METHYLPREDNISOLONE SODIUM SUCC 125 MG IJ SOLR
INTRAMUSCULAR | Status: AC
  Filled 2021-07-23: qty 2

## 2021-07-23 MED ORDER — BUDESONIDE 90 MCG/ACT IN AEPB: 2.0000 | INHALATION_SPRAY | Freq: Two times a day (BID) | RESPIRATORY_TRACT | 3 refills | Status: AC

## 2021-07-23 MED ORDER — ALBUTEROL SULFATE HFA 108 (90 BASE) MCG/ACT IN AERS
2.0000 | INHALATION_SPRAY | Freq: Once | RESPIRATORY_TRACT | Status: AC
  Administered 2021-07-23: 2 via RESPIRATORY_TRACT

## 2021-07-23 MED ORDER — METHYLPREDNISOLONE SODIUM SUCC 125 MG IJ SOLR
125.0000 mg | Freq: Once | INTRAMUSCULAR | Status: AC
  Administered 2021-07-23: 125 mg via INTRAMUSCULAR

## 2021-07-23 MED ORDER — IPRATROPIUM-ALBUTEROL 0.5-2.5 (3) MG/3ML IN SOLN
3.0000 mL | Freq: Once | RESPIRATORY_TRACT | Status: AC
  Administered 2021-07-23: 3 mL via RESPIRATORY_TRACT

## 2021-07-23 NOTE — Discharge Instructions (Addendum)
I am glad that you are feeling better after your breathing treatment.  We gave you a shot of steroids so start prednisone tomorrow (07/24/2021).  Use your albuterol inhaler every 4-6 hours as needed.  Start budesonide twice daily.  This should help prevent asthma exacerbations.  Make sure to rinse your mouth following use of this medication to prevent thrush.  Rest and drink plenty of fluid.  If you have worsening symptoms including shortness of breath, chest pain, heart racing, asthma symptoms not responding to medication you need to be seen immediately.

## 2021-07-23 NOTE — ED Provider Notes (Signed)
[facility]    CSN: [id number] Arrival date & time: 07/23/21  0805      History   Chief Complaint Chief Complaint  Patient presents with   Asthma    HPI Tammy [name] is a 26 y.o. female.   Patient presents today with a several hour history of asthma exacerbation.  Reports that she is out of her asthma medication and is feeling short of breath with cough and chest tightness.  She is typically prescribed inhaled budesonide as well as albuterol to be used as needed.  She denies any recent illness or additional symptoms including fever, nausea, vomiting.  She denies previous hospitalization for asthma.  She is confident that she is not pregnant.  Denies history of diabetes.  Denies any recent antibiotic or steroid use.    Past Medical History:  Diagnosis Date   Arthritis    Asthma    used inhaler 05/14/20   Chlamydia infection    History of chicken pox    Preterm delivery     Patient Active Problem List   Diagnosis Date Noted   Uterine rupture 05/15/2020   Cesarean delivery delivered 05/15/2020   PPH (postpartum hemorrhage) 05/15/2020   Gestational hypertension 05/14/2020   Group B Streptococcus carrier, +RV culture, currently pregnant 04/29/2020   History of inadequate prenatal care 04/15/2020   History of cesarean delivery, currently pregnant 10/22/2019   History of prior pregnancy with IUGR newborn 01/31/2019   History of preterm delivery, currently pregnant    Asthma, severe persistent, poorly-controlled 06/22/2012   Asthma, chronic 06/21/2012   Asthma 11/05/2011    Past Surgical History:  Procedure Laterality Date   CESAREAN SECTION     CESAREAN SECTION N/A 05/14/2020   Procedure: CESAREAN SECTION;  Surgeon: Myna Hidalgo, DO;  Location: [facility];  Service: Obstetrics;  Laterality: N/A;    OB History     Gravida  5   Para  4   Term  3   Preterm  1   AB  1   Living  4      SAB  1   IAB      Ectopic      Multiple  0   Live  Births  4            Home Medications    Prior to Admission medications   Medication Sig Start Date End Date Taking? Authorizing Provider  predniSONE (STERAPRED UNI-PAK 21 TAB) 10 MG (21) TBPK tablet Take by mouth daily. Take 6 tabs by mouth daily  for 2 days, then 5 tabs for 2 days, then 4 tabs for 2 days, then 3 tabs for 2 days, 2 tabs for 2 days, then 1 tab by mouth daily for 2 days 07/23/21  Yes Drewey Begue K, PA-C  acetaminophen (TYLENOL) 500 MG tablet Take 2 tablets (1,000 mg total) by mouth every 6 (six) hours. Patient not taking: Reported on 07/23/2021 05/17/20   Rolm Bookbinder, CNM  albuterol (VENTOLIN HFA) 108 (90 Base) MCG/ACT inhaler Inhale 2 puffs into the lungs every 4 (four) hours as needed for wheezing or shortness of breath. 07/23/21   Shondrea Steinert, Noberto Retort, PA-C  Budesonide 90 MCG/ACT inhaler Inhale 2 puffs into the lungs 2 (two) times daily. 07/23/21   Tiari Andringa, Denny Peon K, PA-C  coconut oil OIL Apply 1 application topically as needed (nipple pain). Patient not taking: Reported on 07/23/2021 05/17/20   Rolm Bookbinder, CNM  ferrous sulfate 325 (65 FE) MG  tablet Take 1 tablet (325 mg total) by mouth every other day. Patient not taking: Reported on 07/23/2021 05/17/20   Rolm Bookbinder, CNM  ibuprofen (ADVIL) 600 MG tablet Take 1 tablet (600 mg total) by mouth every 6 (six) hours. Patient not taking: Reported on 07/23/2021 05/17/20   Rolm Bookbinder, CNM  oxyCODONE (OXY IR/ROXICODONE) 5 MG immediate release tablet Take 1-2 tablets (5-10 mg total) by mouth every 6 (six) hours as needed for severe pain or breakthrough pain. Patient not taking: Reported on 07/23/2021 05/17/20   Rolm Bookbinder, CNM  Prenatal Vit-Fe Phos-FA-Omega (VITAFOL GUMMIES) 3.33-0.333-34.8 MG CHEW Chew 3 tablets by mouth daily. Patient not taking: Reported on 07/23/2021 02/05/20   Brock Bad, MD    Family History Family History  Problem Relation Age of Onset   Migraines Mother    Mitral valve prolapse Mother     Mitral valve prolapse Maternal Grandmother    Dementia Maternal Grandmother    Kidney disease Paternal Grandmother        dialysis   Cancer Paternal Grandfather        Lung   Anemia Maternal Aunt    Mitral valve prolapse Maternal Aunt    Heart disease Paternal Uncle     Social History Social History   Tobacco Use   Smoking status: Former    Packs/day: 0.25    Types: Cigarettes   Smokeless tobacco: Never  Vaping Use   Vaping Use: Never used  Substance Use Topics   Alcohol use: Not Currently    Comment: occ   Drug use: No     Allergies   Pollen extract   Review of Systems Review of Systems  Constitutional:  Positive for activity change. Negative for appetite change, fatigue and fever.  HENT:  Negative for congestion and sore throat.   Respiratory:  Positive for cough, shortness of breath and wheezing. Negative for chest tightness.   Cardiovascular:  Negative for chest pain.  Gastrointestinal:  Negative for abdominal pain, diarrhea, nausea and vomiting.  Neurological:  Negative for dizziness, light-headedness and headaches.     Physical Exam Triage Vital Signs ED Triage Vitals  Enc Vitals Group     BP 07/23/21 0812 132/61     Pulse Rate 07/23/21 0812 86     Resp 07/23/21 0812 16     Temp 07/23/21 0812 98.1 F (36.7 C)     Temp Source 07/23/21 0812 Oral     SpO2 07/23/21 0812 90 %     Weight --      Height --      Head Circumference --      Peak Flow --      Pain Score 07/23/21 0811 0     Pain Loc --      Pain Edu? --      Excl. in GC? --    No data found.  Updated Vital Signs BP 132/61 (BP Location: Right Arm)   Pulse 86   Temp 98.1 F (36.7 C) (Oral)   Resp 16   SpO2 100%   Visual Acuity Right Eye Distance:   Left Eye Distance:   Bilateral Distance:    Right Eye Near:   Left Eye Near:    Bilateral Near:     Physical Exam Vitals reviewed.  Constitutional:      General: She is awake. She is not in acute distress.    Appearance: Normal  appearance. She is well-developed. She is ill-appearing.  Comments: Very pleasant female sitting on exam room table in no acute distress but obviously uncomfortable  HENT:     Head: Normocephalic and atraumatic.  Cardiovascular:     Rate and Rhythm: Normal rate and regular rhythm.     Heart sounds: Normal heart sounds, S1 normal and S2 normal. No murmur heard. Pulmonary:     Effort: Pulmonary effort is normal.     Breath sounds: Wheezing present. No rhonchi or rales.     Comments: Widespread wheezing throughout lung fields.  Significant improvement of symptoms with in office DuoNeb with only trace wheezing in inferior lung fields. Psychiatric:        Behavior: Behavior is cooperative.      UC Treatments / Results  Labs (all labs ordered are listed, but only abnormal results are displayed) Labs Reviewed - No data to display  EKG   Radiology No results found.  Procedures Procedures (including critical care time)  Medications Ordered in UC Medications  ipratropium-albuterol (DUONEB) 0.5-2.5 (3) MG/3ML nebulizer solution 3 mL (3 mLs Nebulization Given 07/23/21 0820)  methylPREDNISolone sodium succinate (SOLU-MEDROL) 125 mg/2 mL injection 125 mg (125 mg Intramuscular Given 07/23/21 0820)  albuterol (VENTOLIN HFA) 108 (90 Base) MCG/ACT inhaler 2 puff (2 puffs Inhalation Given 07/23/21 0849)    Initial Impression / Assessment and Plan / UC Course  I have reviewed the triage vital signs and the nursing notes.  Pertinent labs & imaging results that were available during my care of the patient were reviewed by me and considered in my medical decision making (see chart for details).     Patient has significant improvement of symptoms with Solu-Medrol and DuoNeb in clinic.  Oxygen saturation improved to 100% with significant increase in wheezing on exam.  She was provided a refill of albuterol as well as previously prescribed budesonide.  Discussed that she should rinse her mouth  following use of inhaled corticosteroids to prevent thrush.  We will start prednisone taper and she was instructed not to take NSAIDs with this medication due to risk of GI bleeding.  Patient reported that she would be unable to get to the pharmacy today so was provided an albuterol inhaler to take home and have on hand.  Discussed that this should be used every 4-6 hours as needed.  She is to rest and drink plenty of fluid.  Discussed that if her symptoms or not improving she should be reevaluated within a few days.  If she has any severe symptoms including shortness of breath, chest pain, heart racing, lightheadedness, asthma symptoms not responding to medication she needs to go to the emergency room immediately to which she expressed understanding.  Strict return precautions given.  She declined work excuse note.  Final Clinical Impressions(s) / UC Diagnoses   Final diagnoses:  Moderate persistent asthma with exacerbation     Discharge Instructions      I am glad that you are feeling better after your breathing treatment.  We gave you a shot of steroids so start prednisone tomorrow (07/24/2021).  Use your albuterol inhaler every 4-6 hours as needed.  Start budesonide twice daily.  This should help prevent asthma exacerbations.  Make sure to rinse your mouth following use of this medication to prevent thrush.  Rest and drink plenty of fluid.  If you have worsening symptoms including shortness of breath, chest pain, heart racing, asthma symptoms not responding to medication you need to be seen immediately.     ED Prescriptions  Medication Sig Dispense Auth. Provider   albuterol (VENTOLIN HFA) 108 (90 Base) MCG/ACT inhaler Inhale 2 puffs into the lungs every 4 (four) hours as needed for wheezing or shortness of breath. 18 g Raedyn Klinck K, PA-C   Budesonide 90 MCG/ACT inhaler Inhale 2 puffs into the lungs 2 (two) times daily. 1 each Evonda Enge K, PA-C   predniSONE (STERAPRED UNI-PAK 21 TAB)  10 MG (21) TBPK tablet Take by mouth daily. Take 6 tabs by mouth daily  for 2 days, then 5 tabs for 2 days, then 4 tabs for 2 days, then 3 tabs for 2 days, 2 tabs for 2 days, then 1 tab by mouth daily for 2 days 42 tablet Shahed Yeoman K, PA-C      PDMP not reviewed this encounter.   Terrilee Croak, PA-C 07/23/21 [phone]

## 2021-07-23 NOTE — ED Triage Notes (Signed)
Pt presents with c/o asthma exacerbation that began last night. Pt states she is currently out of her inhalers.

## 2021-08-31 IMAGING — US US OB COMP LESS 14 WK
1 series · 15 of 28 positions shown · non-contrast
Comparison: None.

CLINICAL DATA: Heavy vaginal bleeding, no urine pregnancy test
results available

EXAM:
OBSTETRIC <14 WK ULTRASOUND
TECHNIQUE: Transabdominal ultrasound was performed for evaluation of the
gestation as well as the maternal uterus and adnexal regions.

[Series 1: us ob comp less 14 wk · 35 acquisitions, 15 frames shown]
[im 1/35]
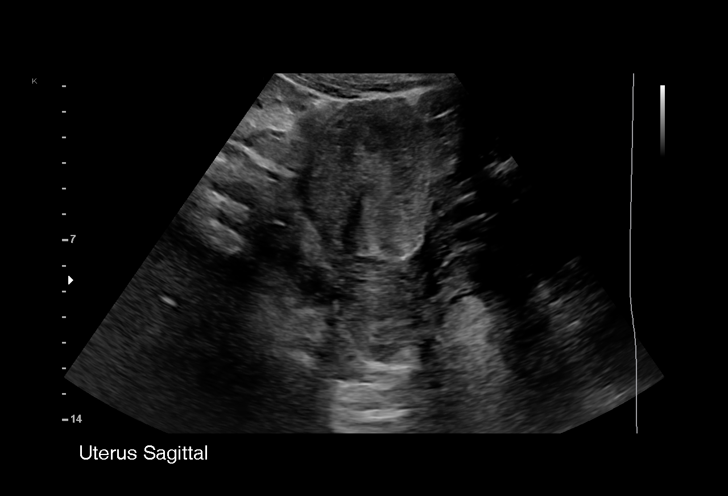
[im 3/35]
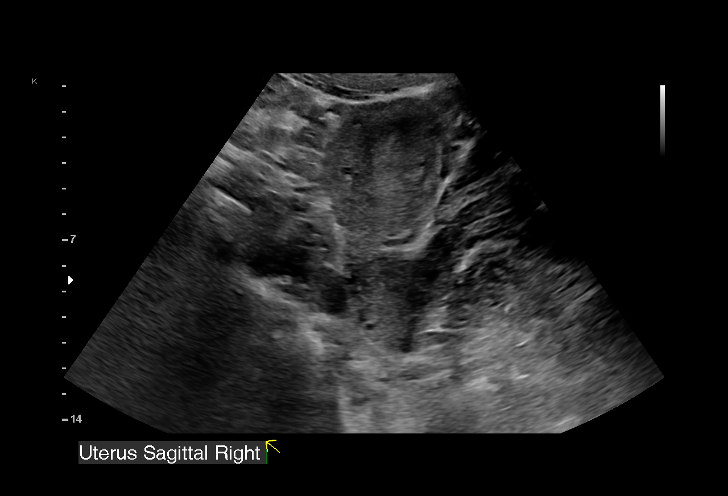
[im 6/35]
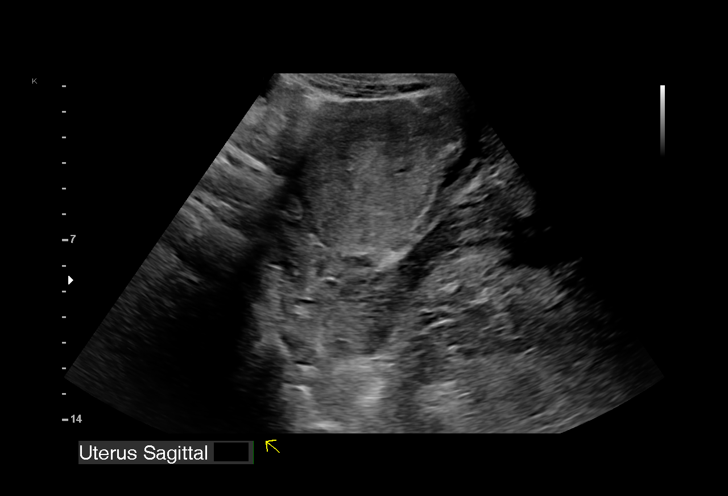
[im 8/35]
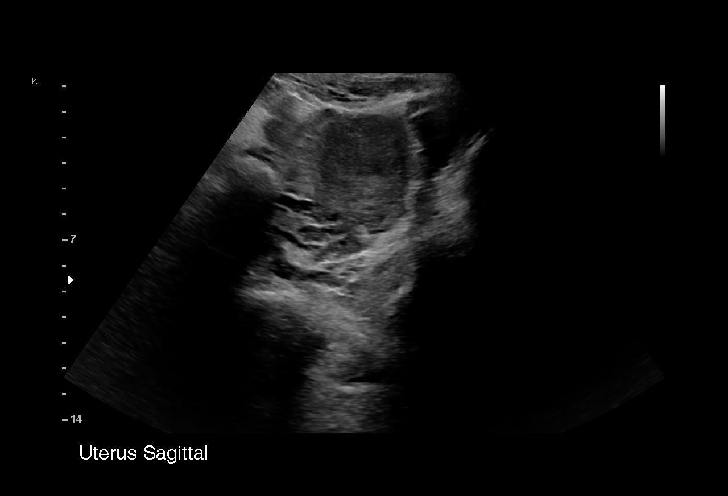
[im 11/35]
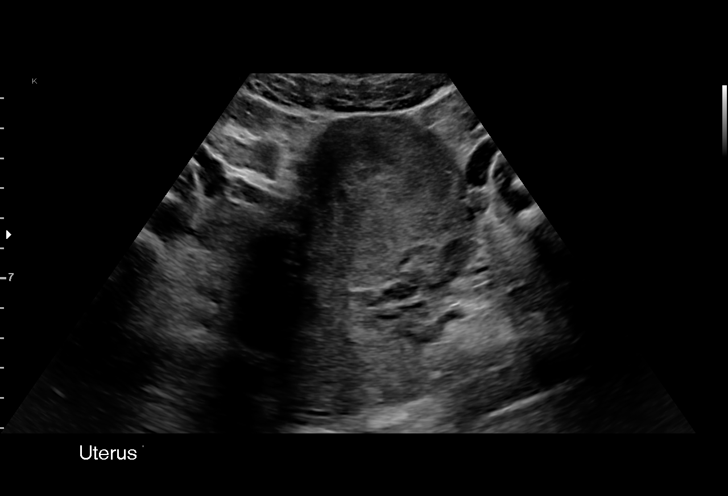
[im 13/35]
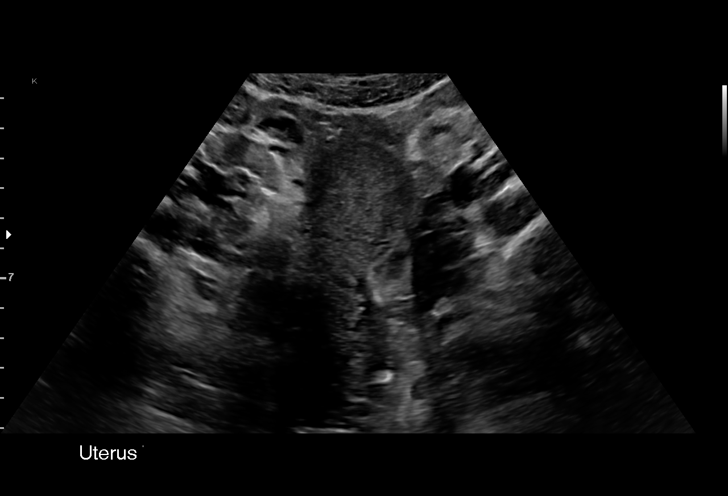
[im 16/35]
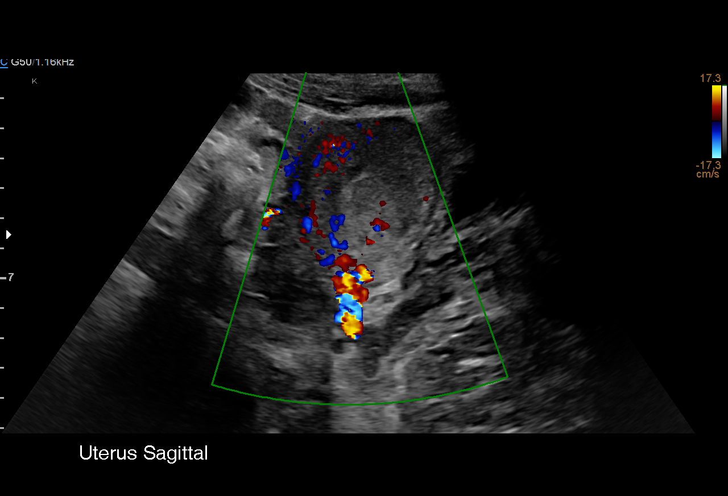
[im 18/35]
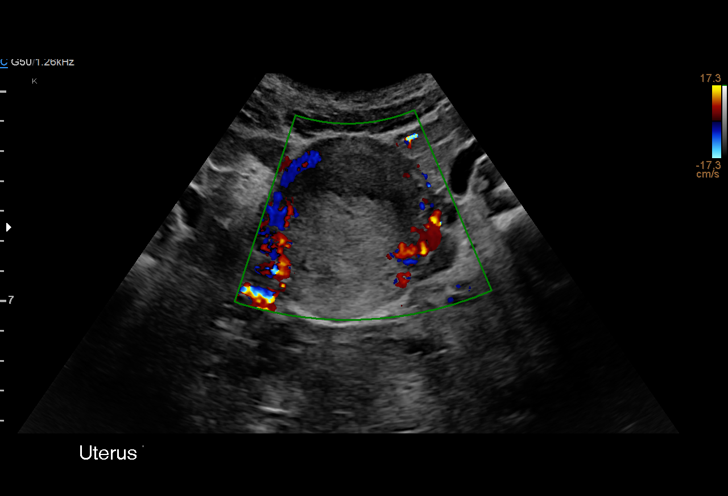
[im 19/35]
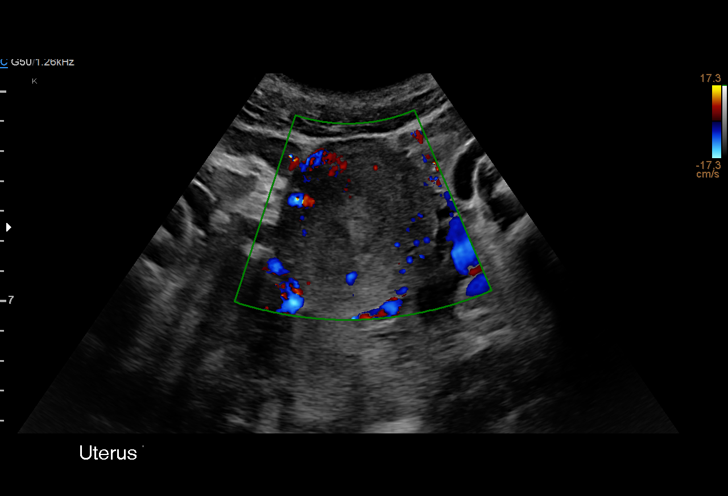
[im 22/35]
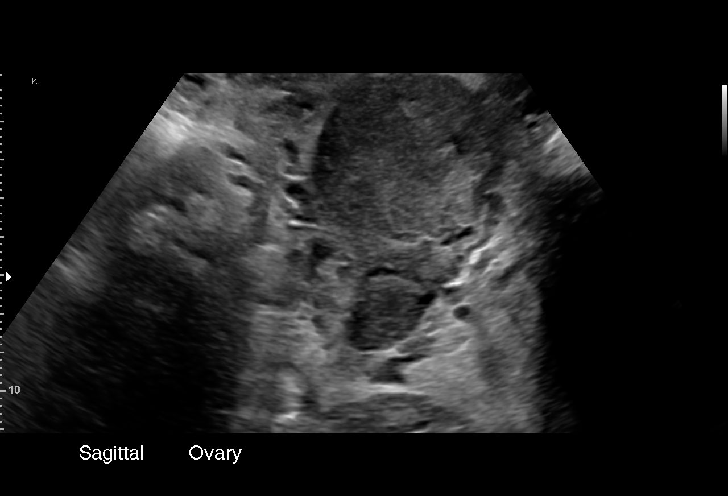
[im 24/35]
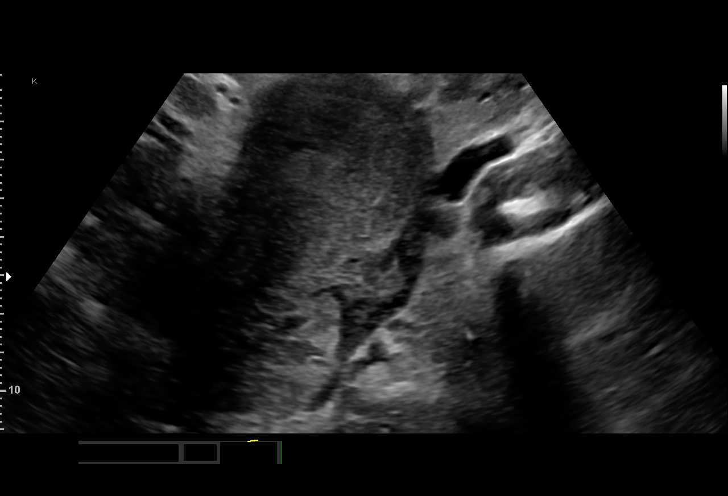
[im 27/35]
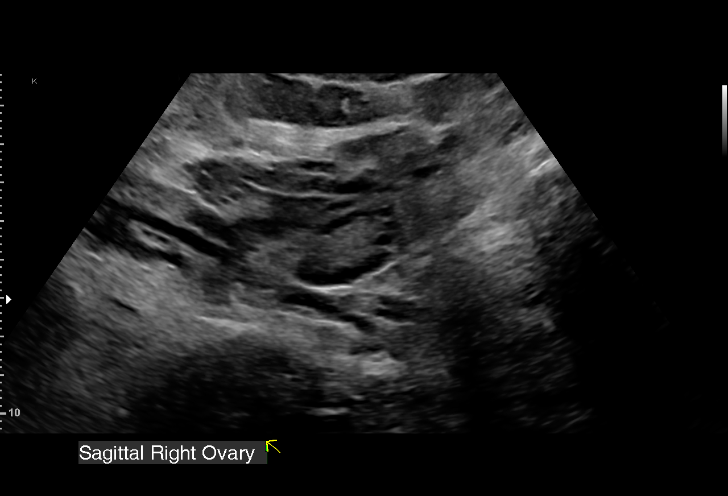
[im 29/35]
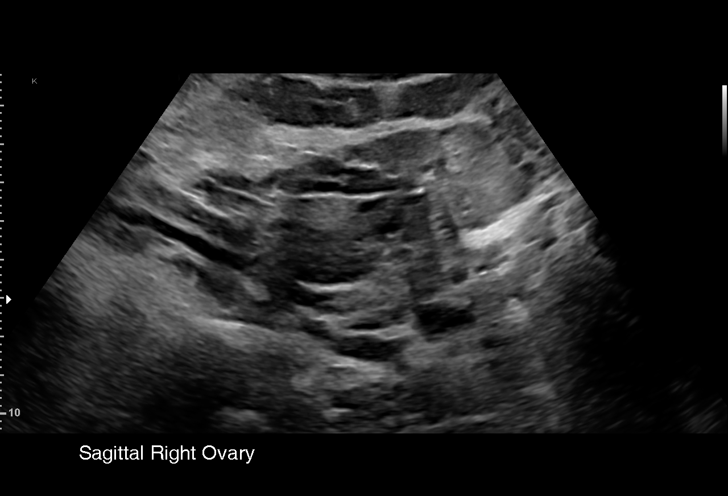
[im 32/35]
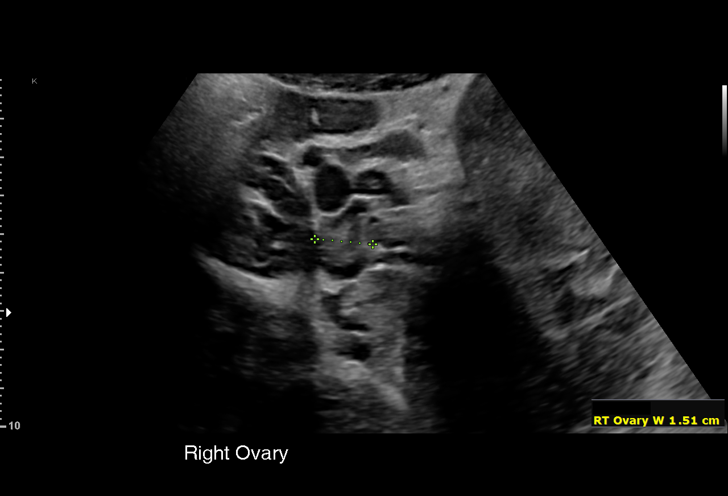
[im 35/35]
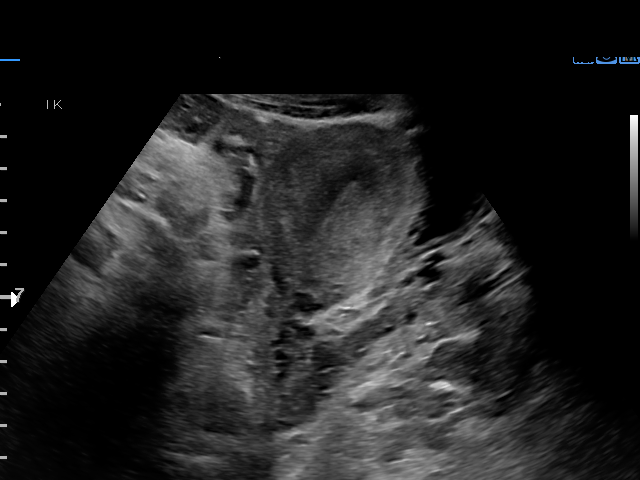

[15 of 28 positions shown; findings below may reference images not displayed]

FINDINGS: Intrauterine gestational sac: None

Yolk sac:  Not Visualized.

Embryo:  Not Visualized.

Maternal uterus/adnexae: Ovaries are within normal limits. Left
ovary measures 3.7 x 1.9 x 1.6 cm. Right ovary measures 3.2 x 1.7 by
1.5 cm. No significant free fluid.
IMPRESSION: No IUP identified. Note that only transabdominal images are
submitted. Findings consistent with pregnancy of unknown location
assuming positive pregnancy test. Differential considerations
include IUP too early to visualize, recent failed pregnancy, and
occult ectopic pregnancy. Recommend correlation with hCG and
trending of HCG values with repeat ultrasound as indicated.

## 2022-01-25 IMAGING — DX DG CHEST 1V PORT
1 series · 1 of 1 positions shown · non-contrast
Comparison: Chest x-ray 04/25/2019.

CLINICAL DATA: Asthma.

EXAM:
PORTABLE CHEST 1 VIEW

[chest ap]
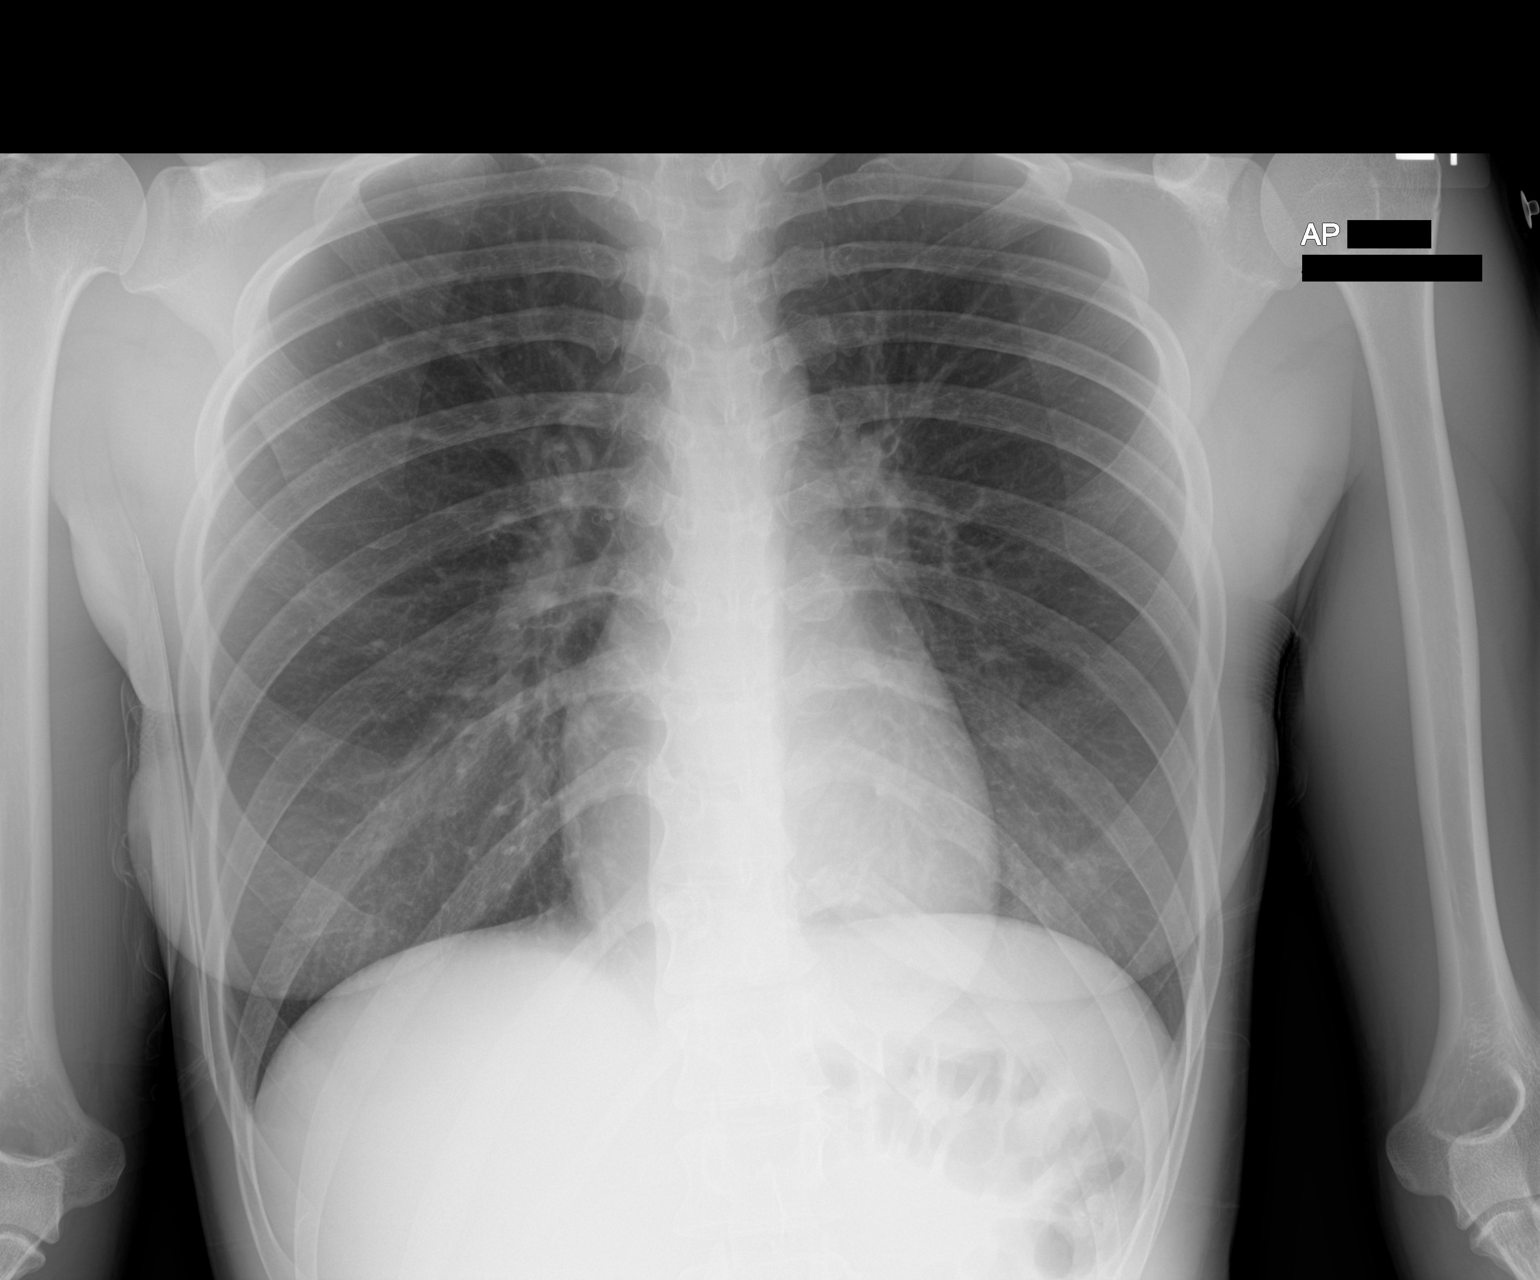

[1 of 1 positions shown; findings below may reference images not displayed]

FINDINGS: Mediastinum and hilar structures normal. Heart size normal. No focal
infiltrate. No pleural effusion or pneumothorax. Degenerative
changes scoliosis thoracic spine.
IMPRESSION: No acute cardiopulmonary disease.

## 2022-12-01 IMAGING — DX DG ABD PORTABLE 1V
1 series · 1 of 1 positions shown · non-contrast
Comparison: None.

CLINICAL DATA: Postoperative examination, instrument check

EXAM:
PORTABLE ABDOMEN - 1 VIEW

[abdomen]
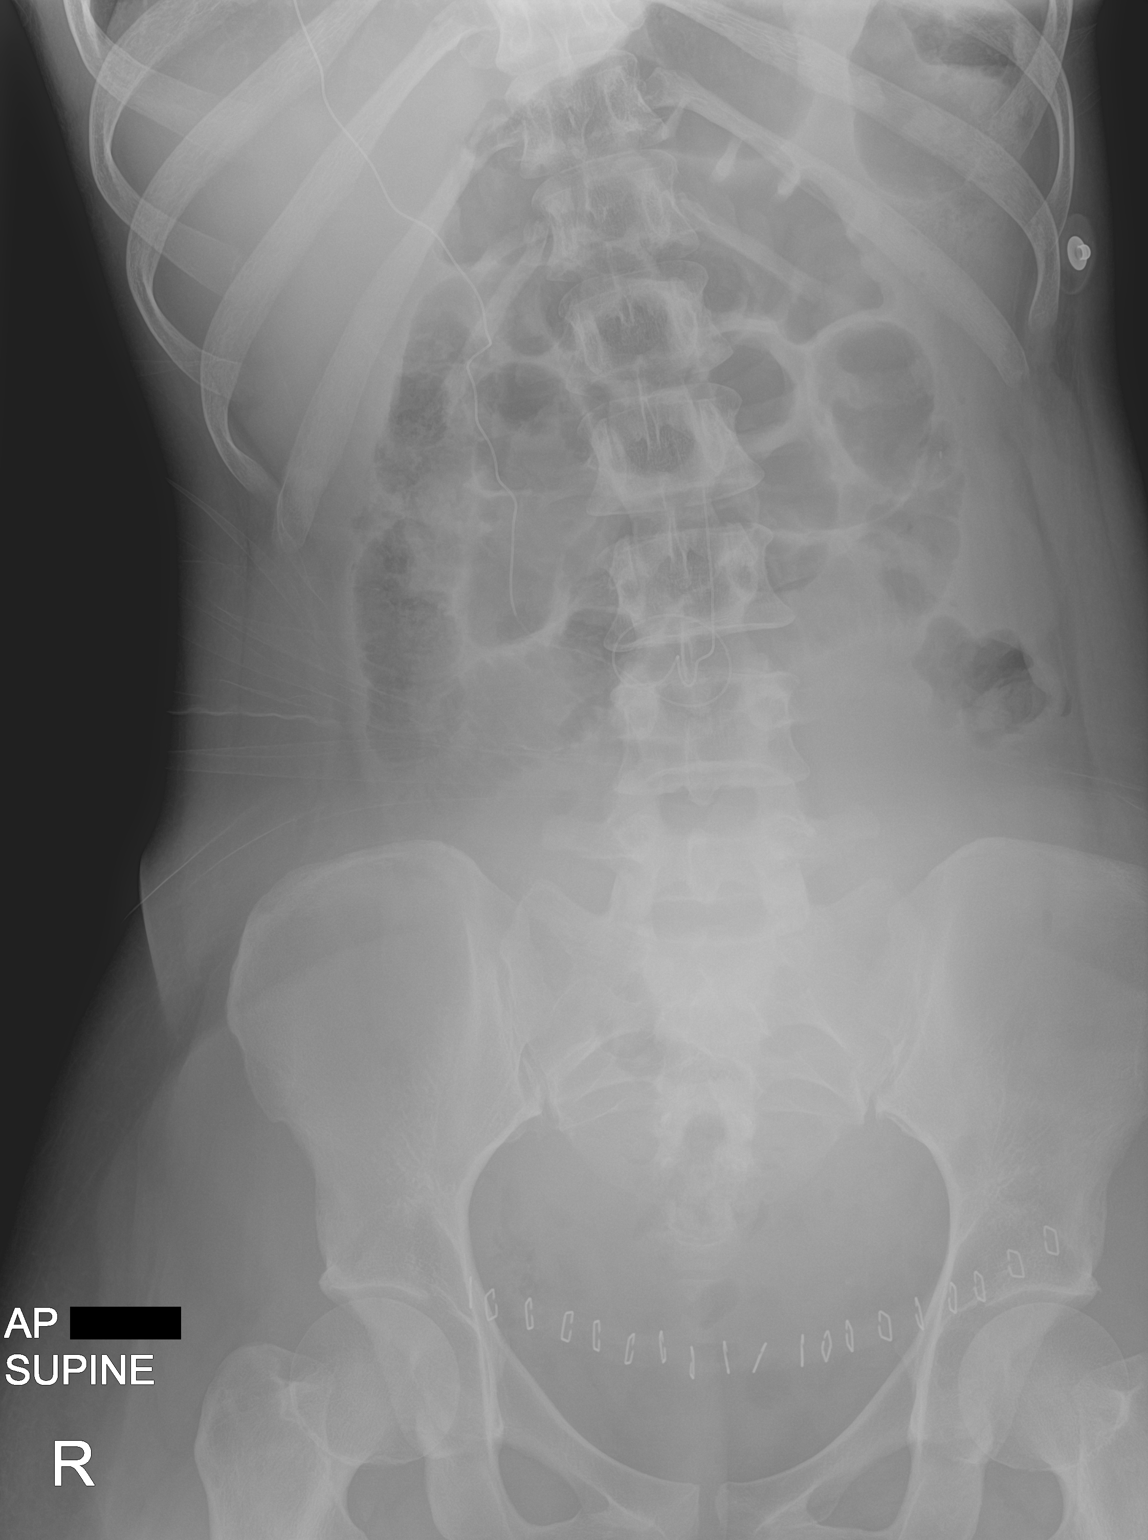

[1 of 1 positions shown; findings below may reference images not displayed]

FINDINGS: Wire like device overlying the right upper quadrant and spinal canal
within the mid lumbar spine is in keeping with an epidural infusion
catheter. The bowel is displaced from the pelvis in keeping with the
hypertrophied uterus. Mild gaseous distension of the visualized
bowel is in keeping with a a mild ileus. No unexpected radiopaque
foreign body within the visualized abdomen. Skin staples overlie and
expected Pfannenstiel incision. Interstitial gas within the pelvis
is likely postsurgical in nature. No acute bone abnormality.
IMPRESSION: No unexpected radiopaque foreign body within the visualized abdomen.
